# Patient Record
Sex: Male | Born: 1955 | Race: White | Hispanic: No | Marital: Single | Smoking: Former smoker
Health system: Southern US, Community
[De-identification: ages and names within clinical notes are randomized; demographics above are authoritative.]

## PROBLEM LIST (undated history)

## (undated) DIAGNOSIS — I10 Essential (primary) hypertension: Secondary | ICD-10-CM

## (undated) DIAGNOSIS — M1990 Unspecified osteoarthritis, unspecified site: Secondary | ICD-10-CM

## (undated) DIAGNOSIS — T7840XA Allergy, unspecified, initial encounter: Secondary | ICD-10-CM

## (undated) DIAGNOSIS — I219 Acute myocardial infarction, unspecified: Secondary | ICD-10-CM

## (undated) HISTORY — DX: Allergy, unspecified, initial encounter: T78.40XA

## (undated) HISTORY — DX: Acute myocardial infarction, unspecified: I21.9

## (undated) HISTORY — PX: MULTIPLE TOOTH EXTRACTIONS: SHX2053

## (undated) HISTORY — DX: Unspecified osteoarthritis, unspecified site: M19.90

---

## 2011-11-19 ENCOUNTER — Ambulatory Visit (INDEPENDENT_AMBULATORY_CARE_PROVIDER_SITE_OTHER): Payer: 59 | Admitting: Physician Assistant

## 2011-11-19 DIAGNOSIS — J301 Allergic rhinitis due to pollen: Secondary | ICD-10-CM

## 2011-11-19 MED ORDER — FLUTICASONE PROPIONATE 50 MCG/ACT NA SUSP: 2.0000 | Freq: Every day | NASAL | Status: DC

## 2011-11-19 NOTE — Patient Instructions (Signed)
Use Zadiator or Naphcon A for itchy allergic eyes.  You can put these in the refrigerator for more relief.

## 2011-11-19 NOTE — Progress Notes (Signed)
  Subjective:    Patient ID: GRANVEL PROUDFOOT, male    DOB: 01-01-1956, 56 y.o.   MRN: [id number]  HPI Mr. Gin comes in today c/o head and sinus congestion with itchy eyes for 2 days. No ST, cough or fever.  Has had trouble with allergies when pollen first falls.  He has used Flonase in the past with great results but ran out and hasn't needed it for several years. Very tired and no energy with onset of symptoms.   Review of Systems As above    Objective:   Physical Exam  Constitutional: He is oriented to person, place, and time. Vital signs are normal.  HENT:  Right Ear: Tympanic membrane normal.  Left Ear: Tympanic membrane normal.  Nose: Mucosal edema and rhinorrhea present.  Mouth/Throat: Oropharynx is clear and moist.  Eyes: Lids are normal. Right conjunctiva is injected. Left conjunctiva is injected.  Cardiovascular: Normal rate and regular rhythm.   Pulmonary/Chest: Effort normal and breath sounds normal.  Neurological: He is alert and oriented to person, place, and time.  Skin: Skin is warm.          Assessment & Plan:  Allergic rhinitis  Flonase with refills.  Zadiator or Naphcon A for eyes. Claritin or Zyrtec. Watch BP.  Needs CPE.

## 2012-06-12 ENCOUNTER — Ambulatory Visit (INDEPENDENT_AMBULATORY_CARE_PROVIDER_SITE_OTHER): Payer: 59 | Admitting: Emergency Medicine

## 2012-06-12 VITALS — BP 144/92 | HR 74 | Temp 98.1°F | Resp 18 | Ht 72.25 in | Wt 275.0 lb

## 2012-06-12 DIAGNOSIS — H00019 Hordeolum externum unspecified eye, unspecified eyelid: Secondary | ICD-10-CM

## 2012-06-12 DIAGNOSIS — H00029 Hordeolum internum unspecified eye, unspecified eyelid: Secondary | ICD-10-CM

## 2012-06-12 MED ORDER — DOXYCYCLINE HYCLATE 100 MG PO CAPS: 100.0000 mg | ORAL_CAPSULE | Freq: Two times a day (BID) | ORAL | Status: DC

## 2012-06-12 MED ORDER — TOBRAMYCIN 0.3 % OP OINT: TOPICAL_OINTMENT | OPHTHALMIC | Status: DC

## 2012-06-12 NOTE — Progress Notes (Signed)
  Subjective:    Patient ID: Mason Mcdonald, male    DOB: October 07, 1955, 56 y.o.   MRN: [id number]  HPI patient states he started having pain in both eyes last night. His eyes feel uncomfortable and swollen. This morning he woke up and his right eye was swollen shut. He noticed crusting in his right. He has significant swelling of the lower lid.    Review of Systems     Objective:   Physical Exam pupils equal round regular react to light direct and consensual. There are no preauricular nodes. There is significant swelling of the right lower lid. This is very tender to touch.        Assessment & Plan:  Tobramycin ointment was instilled in the right. Prescription to be given for doxycycline 100 twice a day and tobramycin ointment were applied to the lower lid 4 times a day

## 2012-06-12 NOTE — Patient Instructions (Addendum)
Sty  A sty (hordeolum) is an infection of a gland in the eyelid located at the base of the eyelash. A sty may develop a white or yellow head of pus. It can be puffy (swollen). Usually, the sty will burst and pus will come out on its own. They do not leave lumps in the eyelid once they drain.  A sty is often confused with another form of cyst of the eyelid called a chalazion. Chalazions occur within the eyelid and not on the edge where the bases of the eyelashes are. They often are red, sore and then form firm lumps in the eyelid.  CAUSES    Germs (bacteria).   Lasting (chronic) eyelid inflammation.  SYMPTOMS    Tenderness, redness and swelling along the edge of the eyelid at the base of the eyelashes.   Sometimes, there is a white or yellow head of pus. It may or may not drain.  DIAGNOSIS   An ophthalmologist will be able to distinguish between a sty and a chalazion and treat the condition appropriately.   TREATMENT    Styes are typically treated with warm packs (compresses) until drainage occurs.   In rare cases, medicines that kill germs (antibiotics) may be prescribed. These antibiotics may be in the form of drops, cream or pills.   If a hard lump has formed, it is generally necessary to do a small incision and remove the hardened contents of the cyst in a minor surgical procedure done in the office.   In suspicious cases, your caregiver may send the contents of the cyst to the lab to be certain that it is not a rare, but dangerous form of cancer of the glands of the eyelid.  HOME CARE INSTRUCTIONS    Wash your hands often and dry them with a clean towel. Avoid touching your eyelid. This may spread the infection to other parts of the eye.   Apply heat to your eyelid for 10 to 20 minutes, several times a day, to ease pain and help to heal it faster.   Do not squeeze the sty. Allow it to drain on its own. Wash your eyelid carefully 3 to 4 times per day to remove any pus.  SEEK IMMEDIATE MEDICAL CARE IF:     Your eye becomes painful or puffy (swollen).   Your vision changes.   Your sty does not drain by itself within 3 days.   Your sty comes back within a short period of time, even with treatment.   You have redness (inflammation) around the eye.   You have a fever.  Document Released: 05/08/2005 Document Revised: 10/21/2011 Document Reviewed: 01/10/2009  ExitCare Patient Information 2013 [employer].

## 2012-07-06 ENCOUNTER — Inpatient Hospital Stay (HOSPITAL_COMMUNITY)
Admission: EM | Admit: 2012-07-06 | Discharge: 2012-07-08 | DRG: 247 | Disposition: A | Discharge status: Home / Self Care | Payer: 59 | Source: Ambulatory Visit | Attending: Cardiology | Admitting: Cardiology

## 2012-07-06 ENCOUNTER — Encounter (HOSPITAL_COMMUNITY): Payer: Self-pay | Admitting: Cardiology

## 2012-07-06 ENCOUNTER — Encounter (HOSPITAL_COMMUNITY)
Admission: EM | Disposition: A | Discharge status: Home / Self Care | Payer: Self-pay | Source: Ambulatory Visit | Attending: Cardiology

## 2012-07-06 ENCOUNTER — Other Ambulatory Visit: Payer: Self-pay

## 2012-07-06 ENCOUNTER — Encounter: Payer: Self-pay | Admitting: Family Medicine

## 2012-07-06 ENCOUNTER — Ambulatory Visit (INDEPENDENT_AMBULATORY_CARE_PROVIDER_SITE_OTHER): Payer: 59 | Admitting: Family Medicine

## 2012-07-06 VITALS — BP 170/110 | HR 116 | Resp 24

## 2012-07-06 DIAGNOSIS — M79609 Pain in unspecified limb: Secondary | ICD-10-CM

## 2012-07-06 DIAGNOSIS — R079 Chest pain, unspecified: Secondary | ICD-10-CM

## 2012-07-06 DIAGNOSIS — Z6835 Body mass index (BMI) 35.0-35.9, adult: Secondary | ICD-10-CM

## 2012-07-06 DIAGNOSIS — Z955 Presence of coronary angioplasty implant and graft: Secondary | ICD-10-CM

## 2012-07-06 DIAGNOSIS — I2119 ST elevation (STEMI) myocardial infarction involving other coronary artery of inferior wall: Principal | ICD-10-CM | POA: Diagnosis present

## 2012-07-06 DIAGNOSIS — R0602 Shortness of breath: Secondary | ICD-10-CM

## 2012-07-06 DIAGNOSIS — E7800 Pure hypercholesterolemia, unspecified: Secondary | ICD-10-CM | POA: Diagnosis present

## 2012-07-06 DIAGNOSIS — I219 Acute myocardial infarction, unspecified: Secondary | ICD-10-CM

## 2012-07-06 DIAGNOSIS — Z823 Family history of stroke: Secondary | ICD-10-CM

## 2012-07-06 DIAGNOSIS — Z8249 Family history of ischemic heart disease and other diseases of the circulatory system: Secondary | ICD-10-CM

## 2012-07-06 DIAGNOSIS — I10 Essential (primary) hypertension: Secondary | ICD-10-CM | POA: Diagnosis present

## 2012-07-06 DIAGNOSIS — I213 ST elevation (STEMI) myocardial infarction of unspecified site: Secondary | ICD-10-CM

## 2012-07-06 DIAGNOSIS — F17200 Nicotine dependence, unspecified, uncomplicated: Secondary | ICD-10-CM | POA: Diagnosis present

## 2012-07-06 HISTORY — PX: LEFT HEART CATHETERIZATION WITH CORONARY ANGIOGRAM: SHX5451

## 2012-07-06 HISTORY — DX: Essential (primary) hypertension: I10

## 2012-07-06 LAB — COMPREHENSIVE METABOLIC PANEL
ALT: 18 U/L (ref 0–53)
AST: 36 U/L (ref 0–37)
Albumin: 3.5 g/dL (ref 3.5–5.2)
Albumin: 3.3 g/dL — ABNORMAL LOW (ref 3.5–5.2)
Alkaline Phosphatase: 50 U/L (ref 39–117)
CO2: 27 mEq/L (ref 19–32)
Calcium: 9.2 mg/dL (ref 8.4–10.5)
Chloride: 100 mEq/L (ref 96–112)
Creatinine, Ser: 0.95 mg/dL (ref 0.50–1.35)
GFR calc non Af Amer: >90 mL/min (ref >90)
Potassium: 3.7 mEq/L (ref 3.5–5.1)
Sodium: 137 mEq/L (ref 135–145)
Total Bilirubin: 0.3 mg/dL (ref 0.3–1.2)
Total Protein: 6.8 g/dL (ref 6.0–8.3)

## 2012-07-06 LAB — CBC
Hemoglobin: 16.5 g/dL (ref 13.0–17.0)
MCHC: 35 g/dL (ref 30.0–36.0)
RDW: 12.9 % (ref 11.5–15.5)
WBC: 8.5 10*3/uL (ref 4.0–10.5)

## 2012-07-06 LAB — LIPID PANEL
HDL: 35 mg/dL — ABNORMAL LOW (ref >39)
LDL Cholesterol: 147 mg/dL — ABNORMAL HIGH (ref 0–99)
Total CHOL/HDL Ratio: 6.2 RATIO
VLDL: 35 mg/dL (ref 0–40)

## 2012-07-06 LAB — CBC WITH DIFFERENTIAL/PLATELET
Basophils Absolute: 0 10*3/uL (ref 0.0–0.1)
Eosinophils Absolute: 0.1 10*3/uL (ref 0.0–0.7)
Eosinophils Relative: 1 % (ref 0–5)
MCH: 30.2 pg (ref 26.0–34.0)
MCHC: 34.5 g/dL (ref 30.0–36.0)
MCV: 87.7 fL (ref 78.0–100.0)
Platelets: 217 10*3/uL (ref 150–400)
RDW: 13.2 % (ref 11.5–15.5)

## 2012-07-06 LAB — APTT
aPTT: 163 seconds — ABNORMAL HIGH (ref 24–37)
aPTT: 94 seconds — ABNORMAL HIGH (ref 24–37)

## 2012-07-06 LAB — CK TOTAL AND CKMB (NOT AT ARMC): Total CK: 182 U/L (ref 7–232)

## 2012-07-06 LAB — PROTIME-INR
INR: 7.98 (ref 0.00–1.49)
INR: 1.79 — ABNORMAL HIGH (ref 0.00–1.49)
Prothrombin Time: 61 seconds — ABNORMAL HIGH (ref 11.6–15.2)

## 2012-07-06 LAB — TROPONIN I: Troponin I: 0.89 ng/mL (ref <0.30)

## 2012-07-06 LAB — MAGNESIUM: Magnesium: 2.1 mg/dL (ref 1.5–2.5)

## 2012-07-06 LAB — POCT ACTIVATED CLOTTING TIME: Activated Clotting Time: 384 seconds

## 2012-07-06 SURGERY — LEFT HEART CATHETERIZATION WITH CORONARY ANGIOGRAM
Anesthesia: LOCAL

## 2012-07-06 MED ORDER — ATORVASTATIN CALCIUM 80 MG PO TABS
80.0000 mg | ORAL_TABLET | Freq: Every day | ORAL | Status: DC
  Administered 2012-07-06 – 2012-07-07 (×2): 80 mg via ORAL
  Filled 2012-07-06 (×3): qty 1

## 2012-07-06 MED ORDER — BIVALIRUDIN 250 MG IV SOLR
INTRAVENOUS | Status: AC
  Filled 2012-07-06: qty 250

## 2012-07-06 MED ORDER — METOPROLOL TARTRATE 12.5 MG HALF TABLET: 6.2500 mg | ORAL_TABLET | Freq: Two times a day (BID) | ORAL | Status: DC

## 2012-07-06 MED ORDER — FENTANYL CITRATE 0.05 MG/ML IJ SOLN
INTRAMUSCULAR | Status: AC
  Filled 2012-07-06: qty 2

## 2012-07-06 MED ORDER — EPTIFIBATIDE BOLUS VIA INFUSION
180.0000 ug/kg | Freq: Once | INTRAVENOUS | Status: AC
  Administered 2012-07-06: 22400 ug via INTRAVENOUS
  Filled 2012-07-06: qty 30

## 2012-07-06 MED ORDER — NITROGLYCERIN IN D5W 200-5 MCG/ML-% IV SOLN: 10.0000 ug/min | INTRAVENOUS | Status: DC

## 2012-07-06 MED ORDER — SODIUM CHLORIDE 0.9 % IV SOLN: INTRAVENOUS | Status: DC | PRN

## 2012-07-06 MED ORDER — PNEUMOCOCCAL VAC POLYVALENT 25 MCG/0.5ML IJ INJ
0.5000 mL | INJECTION | INTRAMUSCULAR | Status: AC
  Filled 2012-07-06: qty 0.5

## 2012-07-06 MED ORDER — TICAGRELOR 90 MG PO TABS
ORAL_TABLET | ORAL | Status: AC
  Administered 2012-07-06: 90 mg via ORAL
  Filled 2012-07-06: qty 2

## 2012-07-06 MED ORDER — LIDOCAINE HCL (PF) 1 % IJ SOLN
INTRAMUSCULAR | Status: AC
  Filled 2012-07-06: qty 30

## 2012-07-06 MED ORDER — MIDAZOLAM HCL 2 MG/2ML IJ SOLN
INTRAMUSCULAR | Status: AC
  Filled 2012-07-06: qty 2

## 2012-07-06 MED ORDER — MORPHINE SULFATE 10 MG/ML IJ SOLN
INTRAMUSCULAR | Status: AC
  Filled 2012-07-06: qty 1

## 2012-07-06 MED ORDER — METOPROLOL TARTRATE 12.5 MG HALF TABLET
12.5000 mg | ORAL_TABLET | Freq: Two times a day (BID) | ORAL | Status: DC
  Filled 2012-07-06 (×2): qty 1

## 2012-07-06 MED ORDER — ACETAMINOPHEN 325 MG PO TABS: 650.0000 mg | ORAL_TABLET | ORAL | Status: DC | PRN

## 2012-07-06 MED ORDER — HEPARIN (PORCINE) IN NACL 2-0.9 UNIT/ML-% IJ SOLN
INTRAMUSCULAR | Status: AC
  Filled 2012-07-06: qty 1000

## 2012-07-06 MED ORDER — EPTIFIBATIDE 75 MG/100ML IV SOLN
INTRAVENOUS | Status: AC
  Administered 2012-07-06: 2 ug/kg/min via INTRAVENOUS
  Filled 2012-07-06: qty 100

## 2012-07-06 MED ORDER — SODIUM CHLORIDE 0.9 % IV SOLN: 0.2500 mg/kg/h | Freq: Once | INTRAVENOUS | Status: DC

## 2012-07-06 MED ORDER — EPTIFIBATIDE 75 MG/100ML IV SOLN
2.0000 ug/kg/min | INTRAVENOUS | Status: AC
  Administered 2012-07-06 – 2012-07-07 (×3): 2 ug/kg/min via INTRAVENOUS
  Filled 2012-07-06 (×5): qty 100

## 2012-07-06 MED ORDER — ASPIRIN EC 81 MG PO TBEC
81.0000 mg | DELAYED_RELEASE_TABLET | Freq: Every day | ORAL | Status: DC
  Administered 2012-07-07 – 2012-07-08 (×2): 81 mg via ORAL
  Filled 2012-07-06 (×2): qty 1

## 2012-07-06 MED ORDER — ALUM & MAG HYDROXIDE-SIMETH 200-200-20 MG/5ML PO SUSP
30.0000 mL | ORAL | Status: DC | PRN
  Administered 2012-07-06: 30 mL via ORAL
  Filled 2012-07-06: qty 30

## 2012-07-06 MED ORDER — OXYCODONE-ACETAMINOPHEN 5-325 MG PO TABS
2.0000 | ORAL_TABLET | Freq: Three times a day (TID) | ORAL | Status: DC | PRN
  Administered 2012-07-06: 2 via ORAL
  Filled 2012-07-06: qty 2

## 2012-07-06 MED ORDER — ONDANSETRON HCL 4 MG/2ML IJ SOLN: 4.0000 mg | Freq: Four times a day (QID) | INTRAMUSCULAR | Status: DC | PRN

## 2012-07-06 MED ORDER — NITROGLYCERIN 0.2 MG/ML ON CALL CATH LAB
INTRAVENOUS | Status: AC
  Filled 2012-07-06: qty 1

## 2012-07-06 MED ORDER — METOPROLOL TARTRATE 25 MG/10 ML ORAL SUSPENSION
6.2500 mg | Freq: Two times a day (BID) | ORAL | Status: DC
  Administered 2012-07-07 – 2012-07-08 (×3): 6.25 mg via ORAL
  Filled 2012-07-06 (×5): qty 2.5

## 2012-07-06 MED ORDER — TICAGRELOR 90 MG PO TABS
90.0000 mg | ORAL_TABLET | Freq: Two times a day (BID) | ORAL | Status: DC
  Administered 2012-07-06 – 2012-07-08 (×4): 90 mg via ORAL
  Filled 2012-07-06 (×6): qty 1

## 2012-07-06 MED ORDER — MORPHINE SULFATE 4 MG/ML IJ SOLN
2.0000 mg | INTRAMUSCULAR | Status: DC | PRN
  Administered 2012-07-06 (×2): 2 mg via INTRAVENOUS
  Filled 2012-07-06: qty 1

## 2012-07-06 MED ORDER — NITROGLYCERIN IN D5W 200-5 MCG/ML-% IV SOLN
INTRAVENOUS | Status: AC
  Filled 2012-07-06: qty 250

## 2012-07-06 MED ORDER — NITROGLYCERIN 0.4 MG SL SUBL: 0.4000 mg | SUBLINGUAL_TABLET | SUBLINGUAL | Status: DC | PRN

## 2012-07-06 MED ORDER — NITROGLYCERIN IN D5W 200-5 MCG/ML-% IV SOLN
10.0000 ug/min | INTRAVENOUS | Status: DC
  Administered 2012-07-06: 10 ug/min via INTRAVENOUS

## 2012-07-06 MED ORDER — TETANUS-DIPHTH-ACELL PERTUSSIS 5-2.5-18.5 LF-MCG/0.5 IM SUSP
INTRAMUSCULAR | Status: AC
  Filled 2012-07-06: qty 0.5

## 2012-07-06 MED ORDER — SODIUM CHLORIDE 0.9 % IV SOLN
INTRAVENOUS | Status: AC
  Administered 2012-07-06 (×2): via INTRAVENOUS

## 2012-07-06 NOTE — Progress Notes (Signed)
[facility] [address] [place] [address] [phone]  Date:  07/06/2012   Name:  Mason Mcdonald   DOB:  03/01/1956   MRN:  [id number]  PCP:  No primary provider on file.    Chief Complaint: No chief complaint on file.   History of Present Illness:  Mason Mcdonald is a 56 y.o. very pleasant male patient who presents with the following:  Brought back urgently with CP today. He has had CP since Saturday off and on.  He has noted CP since about 0800 and it has been a "constand weight on my chest."  He has not noted SOB  He has no prior cardiac history except he is a smoker and has a history of borderline HTN.   He had a stress test several years ago and no follow- up was necessary.   Otherwise he is not aware of any major health problems  There is no problem list on file for this patient.   No past medical history on file.  No past surgical history on file.  History  Substance Use Topics  . Smoking status: Current Every Day Smoker -- 1.0 packs/day for 3 years    Types: Cigarettes  . Smokeless tobacco: Not on file  . Alcohol Use: 0.6 oz/week    1 Cans of beer per week    Family History  Problem Relation Age of Onset  . Cancer Mother   . Heart disease Father   . Stroke Maternal Grandmother   . Stroke Maternal Grandfather   . Cancer Paternal Grandfather     No Known Allergies  Medication list has been reviewed and updated.  Current Outpatient Prescriptions on File Prior to Visit  Medication Sig Dispense Refill  . doxycycline (VIBRAMYCIN) 100 MG capsule Take 1 capsule (100 mg total) by mouth 2 (two) times daily.  20 capsule  0  . fluticasone (FLONASE) 50 MCG/ACT nasal spray Place 2 sprays into the nose daily.  16 g  6  . tobramycin (TOBREX) 0.3 % ophthalmic ointment Thin layer to right lower lid 4 times a day  3.5 g  0    Review of Systems:  As per HPI- otherwise negative.   Physical Examination: There were no vitals filed for this  visit. There were no vitals filed for this visit. There is no height or weight on file to calculate BMI. Ideal Body Weight:    GEN: WDWN, NAD, Non-toxic, A & O x 3, overweight, appears to be in pain HEENT: Atraumatic, Normocephalic. Neck supple. No masses, No LAD. Ears and Nose: No external deformity. CV: RRR, No M/G/R. No JVD. No thrill. No extra heart sounds. PULM: CTA B, no wheezes, crackles, rhonchi. No retractions. No resp. distress. No accessory muscle use EXTR: No c/c/e NEURO Normal gait.  PSYCH: Normally interactive. Conversant. Not depressed or anxious appearing.  Calm demeanor.   #4 81mg  ASA given immediately.   Placed [place] O2, 2L EKG shows ST elevation in II, III and aVF and precordial ST depression.  We do not have an old EKG for comparison IV 20 gauge placed left AC at 9:30 am.  BP 170/110 Nitro 0.4 SL given at 9:35 am, BP still 170 /100 2nd nitro given at 9:40 Pain decreased from 8-9/10 to 6/10 after 2nd nitro.    Assessment and Plan: 1. Chest pain   2. Acute MI    Mason Mcdonald appears to be having a STEMI today.  EMS called for  emergent transport.  They alerted cath lab for code STEMI.  Called Mason Mcdonald's wife Asher Muir and alerted her to meet him at the [facility] ED.  Appreciate cardiology and ED care of this patient/    Abbe Amsterdam, MD

## 2012-07-06 NOTE — CV Procedure (Signed)
Left cardiac cath/PTCA stenting report dictated on 07/06/2012 dictation number is [id number]

## 2012-07-06 NOTE — Progress Notes (Signed)
Right femoral arterial sheath removed without difficulty. Held pressure for 20 min. No hematoma or bleeding.Postive dp pulses before and after sheath removal. Dressing applied and patient instr. In precautions.

## 2012-07-06 NOTE — Progress Notes (Signed)
ANTICOAGULATION CONSULT NOTE - Initial Consult  Pharmacy Consult for integrilin Indication: ACS s/p cath  No Known Allergies  Patient Measurements: Wt= 125kg  Vital Signs: BP: 170/110 mmHg (11/25 1002) Pulse Rate: 116  (11/25 1002)  Labs:  Basename 07/06/12 1030  HGB 16.5  HCT 47.1  PLT 215  APTT 163*  LABPROT 61.0*  INR 7.98*  HEPARINUNFRC --  CREATININE 1.00  CKTOTAL 182  CKMB 11.4*  TROPONINI 0.89*    Medical History: No past medical history on file.  Medications:  Scheduled:    . [COMPLETED] bivalirudin      . bivalirudin (ANGIOMAX) infusion 5 mg/mL (Cath Lab,ACS,PCI indication)  0.25 mg/kg/hr Intravenous Once  . [COMPLETED] eptifibatide      . [COMPLETED] fentaNYL      . [COMPLETED] heparin      . [COMPLETED] lidocaine      . [COMPLETED] midazolam      . [COMPLETED] morphine      . [COMPLETED] nitroGLYCERIN      . [COMPLETED] nitroGLYCERIN      . [COMPLETED] TDaP      . [COMPLETED] Ticagrelor        Assessment: 56 yo male s/p cath to begin integrilin.   Plan -Integrilin single bolus followed by 2mg /kg/min -Platelet count in 8hrs  Harland German, Pharm D 07/06/2012 12:27 PM

## 2012-07-06 NOTE — H&P (Cosign Needed)
Mason Mcdonald is an 56 y.o. male.   Chief Complaint: Chest pain HPI: Patient is 56 year old male with past medical history significant for hypertension tobacco abuse morbid obesity positive family history of coronary artery disease not on any medications came to [facility] cath lab directly as code STEMI was called. Patient complaints of recurrent retrosternal chest pain since Saturday off and on this morning woke up with severe chest pain radiating to both arms associated with mild shortness of breath EKG done on the field showed a sinus tachycardia with right bundle branch block with the Q waves with ST elevation in infero posterior leads . Patient states he did not seek any medical attention as pain got worst today so called EMS. Denies any nausea vomiting diaphoresis denies palpitation lightheadedness or syncope. Denies any cardiac workup in the past.  No past medical history on file.  No past surgical history on file.  Family History  Problem Relation Age of Onset  . Cancer Mother   . Heart disease Father   . Stroke Maternal Grandmother   . Stroke Maternal Grandfather   . Cancer Paternal Grandfather    Social History:  reports that he has been smoking Cigarettes.  He has a 3 pack-year smoking history. He does not have any smokeless tobacco history on file. He reports that he drinks about .6 ounces of alcohol per week. He reports that he does not use illicit drugs.  Allergies: No Known Allergies  Medications Prior to Admission  Medication Sig Dispense Refill  . doxycycline (VIBRAMYCIN) 100 MG capsule Take 1 capsule (100 mg total) by mouth 2 (two) times daily.  20 capsule  0  . fluticasone (FLONASE) 50 MCG/ACT nasal spray Place 2 sprays into the nose daily.  16 g  6  . tobramycin (TOBREX) 0.3 % ophthalmic ointment Thin layer to right lower lid 4 times a day  3.5 g  0    Results for orders placed during the hospital encounter of 07/06/12 (from the past 48 hour(s))  CBC      Status: Normal   Collection Time   07/06/12 10:30 AM      Component Value Range Comment   WBC 8.5  4.0 - 10.5 K/uL    RBC 5.48  4.22 - 5.81 MIL/uL    Hemoglobin 16.5  13.0 - 17.0 g/dL    HCT 16.1  09.6 - 04.5 %    MCV 85.9  78.0 - 100.0 fL    MCH 30.1  26.0 - 34.0 pg    MCHC 35.0  30.0 - 36.0 g/dL    RDW 40.9  81.1 - 91.4 %    Platelets 215  150 - 400 K/uL   COMPREHENSIVE METABOLIC PANEL     Status: Abnormal   Collection Time   07/06/12 10:30 AM      Component Value Range Comment   Sodium 137  135 - 145 mEq/L    Potassium 3.7  3.5 - 5.1 mEq/L    Chloride 100  96 - 112 mEq/L    CO2 27  19 - 32 mEq/L    Glucose, Bld 115 (*) 70 - 99 mg/dL    BUN 16  6 - 23 mg/dL    Creatinine, Ser 7.82  0.50 - 1.35 mg/dL    Calcium 9.6  8.4 - 95.6 mg/dL    Total Protein 6.8  6.0 - 8.3 g/dL    Albumin 3.5  3.5 - 5.2 g/dL    AST 25  0 - 37 U/L    ALT 18  0 - 53 U/L    Alkaline Phosphatase 50  39 - 117 U/L    Total Bilirubin 0.3  0.3 - 1.2 mg/dL    GFR calc non Af Amer 82 (*) >90 mL/min    GFR calc Af Amer >90  >90 mL/min   LIPID PANEL     Status: Abnormal   Collection Time   07/06/12 10:30 AM      Component Value Range Comment   Cholesterol 217 (*) 0 - 200 mg/dL    Triglycerides 578 (*) <150 mg/dL    HDL 35 (*) >46 mg/dL    Total CHOL/HDL Ratio 6.2      VLDL 35  0 - 40 mg/dL    LDL Cholesterol 962 (*) 0 - 99 mg/dL    No results found.  Review of Systems  Constitutional: Negative for fever, chills and weight loss.  Eyes: Negative for blurred vision and double vision.  Respiratory: Negative for cough, hemoptysis and sputum production.   Cardiovascular: Positive for chest pain. Negative for palpitations, orthopnea, claudication and leg swelling.  Gastrointestinal: Negative for nausea, vomiting and abdominal pain.  Genitourinary: Negative for dysuria.  Neurological: Positive for dizziness. Negative for headaches.    There were no vitals taken for this visit. Physical Exam    Constitutional: He is oriented to person, place, and time. He appears well-developed and well-nourished.  HENT:  Head: Atraumatic.  Eyes: Conjunctivae normal and EOM are normal. Pupils are equal, round, and reactive to light. Left eye exhibits discharge.  Neck: Neck supple. No JVD present. No tracheal deviation present.  Cardiovascular: Normal rate and regular rhythm.   Murmur (Soft systolic murmur and S4 gallop noted) heard. Respiratory: Effort normal and breath sounds normal. No respiratory distress. He has no wheezes.  GI: Soft. Bowel sounds are normal. He exhibits no distension. There is no tenderness. There is no rebound.  Musculoskeletal: He exhibits no edema and no tenderness.  Neurological: He is alert and oriented to person, place, and time.     Assessment/Plan Acute inferoposterolateral Hypertension Morbid obesity Positive family history of coronary artery disease Tobacco abuse Plan  discussed with patient regarding emergency  left cath possible PTCA stenting its risk and benefits i.e. death MI stroke need for emergency CABG local last complications and consents for PCI [facility] N 07/06/2012, 11:39 AM

## 2012-07-07 ENCOUNTER — Other Ambulatory Visit: Payer: Self-pay

## 2012-07-07 LAB — CBC
MCH: 29.6 pg (ref 26.0–34.0)
MCHC: 33.8 g/dL (ref 30.0–36.0)
Platelets: 223 10*3/uL (ref 150–400)

## 2012-07-07 LAB — LIPID PANEL
Cholesterol: 200 mg/dL (ref 0–200)
Total CHOL/HDL Ratio: 6.3 RATIO

## 2012-07-07 LAB — TSH: TSH: 1.737 u[IU]/mL (ref 0.350–4.500)

## 2012-07-07 LAB — BASIC METABOLIC PANEL
Calcium: 9.4 mg/dL (ref 8.4–10.5)
GFR calc non Af Amer: 77 mL/min — ABNORMAL LOW (ref >90)
Glucose, Bld: 117 mg/dL — ABNORMAL HIGH (ref 70–99)
Sodium: 138 mEq/L (ref 135–145)

## 2012-07-07 MED ORDER — ALPRAZOLAM 0.25 MG PO TABS
0.2500 mg | ORAL_TABLET | Freq: Three times a day (TID) | ORAL | Status: DC | PRN
  Administered 2012-07-07: 0.25 mg via ORAL
  Filled 2012-07-07: qty 1

## 2012-07-07 MED FILL — Dextrose Inj 5%: INTRAVENOUS | Qty: 50 | Status: AC

## 2012-07-07 NOTE — Progress Notes (Signed)
Pt may benefit from outpatient sleep study.  When sleeping pt snores very loudly and HR drops to the 40's then goes back up to 70-80's.  Pt aroused easily and denies complaints.  He also stated that his doctors at [facility] had mentioned that he needed a sleep study in the past but it was never set up. Dierdre Highman

## 2012-07-07 NOTE — Progress Notes (Signed)
CARDIAC REHAB PHASE I   PRE:  Rate/Rhythm: 68 SR    BP: sitting 144/76    SaO2:   MODE:  Ambulation: 550 ft   POST:  Rate/Rhythm: 100    BP: sitting 164/90     SaO2:   Tolerated well. No c/o. BP elevated after walk. Pt sts he had what he wondered was a panic attack earlier. Feels congested and couldn't breathe well. Ed began including smoking cessation, meds, risk factor modification and CRPII. Requests his name be sent to [place] CRPII. Pt eager to make change. Will f/u in am to finish ed. [phone]  Harriet Masson CES, ACSM

## 2012-07-07 NOTE — Cardiovascular Report (Signed)
NAME:  Mason Mcdonald, WASZAK NO.:  [id number]  MEDICAL RECORD NO.:  [id number]  LOCATION:  2906                         FACILITY:  [facility]  PHYSICIAN:  Eduardo Osier. Sharyn Lull, M.D. DATE OF BIRTH:  1956/07/18  DATE OF PROCEDURE:  07/06/2012 DATE OF DISCHARGE:                           CARDIAC CATHETERIZATION   PROCEDURES: 1. Left cardiac cath with selective left and right coronary     angiography, left ventriculography, via right groin using Judkins     technique. 2. Successful percutaneous transluminal coronary angioplasty to mid     left circumflex using 2.5 x 12 mm long Emerge balloon. 3. Successful deployment of 3.0 x 28 mm long Xience extubation drug-     eluting stent in mid left circumflex. 4. Successful postdilatation of the stent using 3.5 x 15 mm long [place]     Quantum balloon.  INDICATION FOR THE PROCEDURE:  Mr. Mason Mcdonald is a 56 year old male with past medical history significant for hypertension, tobacco abuse, morbid obesity, positive family history of coronary artery disease.  Not on any medications.  He came to [facility] Cath Lab directly as code STEMI was called.  The patient complained of recurrent retrosternal chest pain since Saturday off and on.  This morning woke up with severe chest pain radiating to both arms associated with mild shortness of breath.  EKG done on the field showed sinus tachycardia with right bundle branch block with Q-waves with ST elevation in the inferoposterior leads.  The patient states he did not seek any medical attention.  His pain got worse today, so called EMS.  Denies any nausea, vomiting, diaphoresis.  Denies palpitation, lightheadedness, or syncope. Denies any cardiac workup in the past.  Due to typical anginal chest pain, EKG changes, discussed briefly with the patient in the cath lab regarding emergency left cath, possible PTCA stenting, its risks and benefits i.e. death, MI, stroke, need for emergency CABG, local  vascular complications etc and consented for PCI.  PROCEDURE IN DETAIL:  After obtaining informed consent, the patient was placed on fluoroscopy table.  Right and left groin were prepped and draped in usual fashion.  Xylocaine 1% was used for local anesthesia in the right groin.  With the help of thin wall needle, 6-French arterial sheath was placed.  The sheath was aspirated and flushed.  Next, 6- French left Judkins catheter was advanced over the wire under fluoroscopic guidance up to the ascending aorta.  Wire was pulled out. The catheter was aspirated and connected.  Manifold catheter was further advanced and engaged into left coronary ostium.  Multiple views of the left system were taken.  Next, the catheter was disengaged and was pulled out over the wire and was replaced with 6-French right Judkins catheter, which was advanced over the wire under fluoroscopic guidance up to the ascending aorta.  Wire was pulled out.  The catheter was aspirated and connected to the Manifold.  Catheter was further advanced and engaged into right coronary ostium.  Multiple views of the right system were taken.  Next, catheter was disengaged and was pulled out over the wire and was replaced with 6-French pigtail catheter at the end of  the procedure which was advanced over the wire under fluoroscopic guidance up to the ascending aorta.  Catheter was further advanced across the aortic valve into the LV.  LV pressures were recorded.  Next, LV graphy was done in 30-degree RAO position.  Postangiographic pressures were recorded from LV and then pullback pressures were recorded from the aorta.  There was no significant gradient across the aortic valve.  Next, pigtail catheter was pulled out over the wire. Sheaths were aspirated and flushed.  FINDINGS:  LV showed inferior, mid wall severe hypokinesis, EF of approximately 50%.  Left main was patent.  LAD has 15-20% proximal and mid stenosis.  Diagonal 1 was  large which has 20% proximal stenosis. Left circumflex was 100% occluded beyond the midportion.  RCA has 50-60% proximal __________ anteriorly to 30% distal stenosis prior to the bifurcation.  PDA and PLV branches were small which were patent.  RCA was providing collaterals to the distal left circumflex.  INTERVENTIONAL PROCEDURE:  Successful PTCA to mid left circumflex/obtuse marginal was done using 2.5 x 12 mm long Emerge balloon for predilatation and then 3.0 x 28 mm long Xience expedition drug-eluting stent was deployed at 11 atmospheric pressure.  The stent was postdilated using 3.5 x 15 mm long [place] Quantum balloon going up to 13 atmospheric pressure and lesion dilated from 100% to 0% residual with excellent TIMI grade 3 distal flow without evidence of dissection or distal embolization.  The patient received weight based Angiomax and 180 mg of Brilinta prior to the procedure.  The patient tolerated the procedure well.  There were no complications.  The patient was transferred to recovery room in stable condition.     Eduardo Osier. Sharyn Lull, M.D.     MNH/MEDQ  D:  07/06/2012  T:  07/07/2012  Job:  [id number]

## 2012-07-07 NOTE — Care Management Note (Signed)
    Page 1 of 1   07/07/2012     10:11:24 AM   CARE MANAGEMENT NOTE 07/07/2012  Patient:  Mason Mcdonald, Mason Mcdonald   Account Number:  [id number]  Date Initiated:  07/06/2012  Documentation initiated by:  Junius Creamer  Subjective/Objective Assessment:   adm w ch pain     Action/Plan:   lives w wife, goes to [facility] urgent care as pcp   Anticipated DC Date:     Anticipated DC Plan:  HOME/SELF CARE      DC Planning Services  CM consult  Medication Assistance      Choice offered to / List presented to:             Status of service:   Medicare Important Message given?   (If response is "NO", the following Medicare IM given date fields will be blank) Date Medicare IM given:   Date Additional Medicare IM given:    Discharge Disposition:  HOME/SELF CARE  Per UR Regulation:  Reviewed for med. necessity/level of care/duration of stay  If discussed at Long Length of Stay Meetings, dates discussed:    Comments:  11/26 10am debbie Sanaya Gwilliam rn,bsn spoke w pt. gave pt brilinta 30day free card and copay assist card. he states he does have ins that covers meds. did leave pt assist form and copay card for brand name meds also. could not find exact copay amt.  11/25 13:28 debbie Kaylianna Detert rn,bsn [phone]

## 2012-07-07 NOTE — Progress Notes (Signed)
Subjective:  Patient denies any chest pain or shortness of breath states feels much better  Objective:  Vital Signs in the last 24 hours: Temp:  [97.7 F (36.5 C)-98.1 F (36.7 C)] 97.8 F (36.6 C) (11/26 0807) Pulse Rate:  [48-85] 85  (11/26 1011) Resp:  [9-16] 10  (11/26 0800) BP: (93-144)/(53-92) 127/61 mmHg (11/26 0700) SpO2:  [94 %-99 %] 97 % (11/26 0800) Arterial Line BP: (122-137)/(73-82) 122/74 mmHg (11/25 1545) Weight:  [123 kg (271 lb 2.7 oz)-124.7 kg (274 lb 14.6 oz)] 123 kg (271 lb 2.7 oz) (11/25 2000)  Intake/Output from previous day: 11/25 0701 - 11/26 0700 In: 2487.5 [P.O.:600; I.V.:1887.5] Out: 2175 [Urine:2175] Intake/Output from this shift:    Physical Exam: Neck: no adenopathy, no carotid bruit, no JVD and supple, symmetrical, trachea midline Lungs: clear to auscultation bilaterally Heart: regular rate and rhythm, S1, S2 normal, no murmur, click, rub or gallop Abdomen: soft, non-tender; bowel sounds normal; no masses,  no organomegaly Extremities: extremities normal, atraumatic, no cyanosis or edema and Right groin stable no evidence of hematoma or bruit  Lab Results:  Basename 07/07/12 0525 07/06/12 1424  WBC 9.6 9.0  HGB 15.7 15.3  PLT 223 217    Basename 07/07/12 0525 07/06/12 1424  NA 138 138  K 3.6 4.0  CL 101 102  CO2 28 27  GLUCOSE 117* 103*  BUN 13 15  CREATININE 1.06 0.95    Basename 07/06/12 2353 07/06/12 1030  TROPONINI 7.80* 0.89*   Hepatic Function Panel  Basename 07/06/12 1424  PROT 6.2  ALBUMIN 3.3*  AST 36  ALT 18  ALKPHOS 46  BILITOT 0.3  BILIDIR --  IBILI --    Basename 07/07/12 0525  CHOL 200   No results found for this basename: PROTIME in the last 72 hours  Imaging: Imaging results have been reviewed and No results found.  Cardiac Studies:  Assessment/Plan:  Status post Acute inferoposterolateral  Hypertension  Morbid obesity  Positive family history of coronary artery disease  Tobacco  abuse Plan Phase I cardiac rehabilitation Transfer to telemetry Check labs in a.m.  LOS: 1 day    Jaylin Benzel N 07/07/2012, 10:46 AM

## 2012-07-08 LAB — BASIC METABOLIC PANEL
CO2: 26 mEq/L (ref 19–32)
Chloride: 103 mEq/L (ref 96–112)
Creatinine, Ser: 1.16 mg/dL (ref 0.50–1.35)
Glucose, Bld: 101 mg/dL — ABNORMAL HIGH (ref 70–99)

## 2012-07-08 LAB — CK TOTAL AND CKMB (NOT AT ARMC)
CK, MB: 4.7 ng/mL — ABNORMAL HIGH (ref 0.3–4.0)
Total CK: 136 U/L (ref 7–232)

## 2012-07-08 LAB — CBC
Hemoglobin: 15.5 g/dL (ref 13.0–17.0)
MCV: 87.4 fL (ref 78.0–100.0)
Platelets: 218 10*3/uL (ref 150–400)
RBC: 5.33 MIL/uL (ref 4.22–5.81)
WBC: 10.4 10*3/uL (ref 4.0–10.5)

## 2012-07-08 LAB — TROPONIN I: Troponin I: 2.1 ng/mL (ref <0.30)

## 2012-07-08 MED ORDER — RAMIPRIL 2.5 MG PO CAPS: 2.5000 mg | ORAL_CAPSULE | Freq: Every day | ORAL | Status: DC

## 2012-07-08 MED ORDER — ASPIRIN 81 MG PO TBEC: 81.0000 mg | DELAYED_RELEASE_TABLET | Freq: Every day | ORAL | Status: AC

## 2012-07-08 MED ORDER — METOPROLOL SUCCINATE ER 25 MG PO TB24: 12.5000 mg | ORAL_TABLET | Freq: Every day | ORAL | Status: AC

## 2012-07-08 MED ORDER — NITROGLYCERIN 0.4 MG SL SUBL: 0.4000 mg | SUBLINGUAL_TABLET | SUBLINGUAL | Status: AC | PRN

## 2012-07-08 MED ORDER — ATORVASTATIN CALCIUM 80 MG PO TABS: 80.0000 mg | ORAL_TABLET | Freq: Every day | ORAL | Status: AC

## 2012-07-08 MED ORDER — TICAGRELOR 90 MG PO TABS: 90.0000 mg | ORAL_TABLET | Freq: Two times a day (BID) | ORAL | Status: DC

## 2012-07-08 NOTE — Discharge Summary (Signed)
  Discharge summary dictated on 07/08/2012 dictation number is [id number]

## 2012-07-08 NOTE — Discharge Summary (Signed)
NAME:  Mason Mcdonald, Mason Mcdonald NO.:  [id number]  MEDICAL RECORD NO.:  [id number]  LOCATION:  [id number]                        FACILITY:  [facility]  PHYSICIAN:  Eduardo Osier. Sharyn Lull, M.D. DATE OF BIRTH:  02-02-1956  DATE OF ADMISSION:  07/06/2012 DATE OF DISCHARGE:  07/08/2012                              DISCHARGE SUMMARY   ADMITTING DIAGNOSES: 1. Acute inferoposterior wall myocardial infarction, hypertension. 2. Morbid obesity. 3. Positive family history of coronary artery disease 4. Tobacco abuse.  DISCHARGE DIAGNOSES: 1. Status post acute inferoposterior wall myocardial infarction,     status post emergency PTCA stenting to left circumflex/obtuse     marginal. 2. Hypertension. 3. Hypercholesteremia. 4. Morbid obesity. 5. Positive family history of coronary artery disease. 6. Tobacco abuse.  DISCHARGE HOME MEDICATIONS: 1. Enteric-coated aspirin 81 mg 1 tablet daily. 2. Brilinta 90 mg 1 tablet twice daily. 3. Atorvastatin 80 mg 1 tablet daily. 4. Toprol-XL 12.5 mg daily. 5. Ramipril 2.5 mg 1 capsule daily. 6. Nitrostat 0.4 mg sublingual use as directed.  DIET:  Low salt, low cholesterol.  ACTIVITY:  Increase activity slowly as tolerated.  Post PTCA stent instructions have been given.  Follow up with me in 2 weeks.  The patient will be scheduled for phase 2 cardiac rehab as outpatient.  The patient has been discussed at length regarding lifestyle modification, smoking cessations, and diet.  CONDITION AT DISCHARGE:  Stable.  BRIEF HISTORY AND HOSPITAL COURSE:  Mason Mcdonald is a 56 year old male with past medical history significant for hypertension, tobacco abuse, morbid obesity, positive family history of coronary artery disease, not on any medications.  He came to the [facility] cath lab directly as code STEMI was called.  The patient complained of recurrent retrosternal chest pain since Saturday off and on.  This morning woke up with severe chest pain  radiating to both arms, associated with mild shortness of breath.  EKG done on the field showed sinus tachycardia with right bundle-branch block with Q-waves with ST-elevation in inferoposterior leads.  The patient states he did not seek any medical attention.  As pain got worse today, so called EMS.  The patient denies any nausea, vomiting, diaphoresis.  Denies palpitation, lightheadedness, or syncope. Denies any cardiac workup in the past.  PAST MEDICAL HISTORY:  As above.  PAST SURGICAL HISTORY:  None.  SOCIAL HISTORY:  He smokes 1 pack per day for last 3-4 years.  No history of alcohol abuse.  Drinks occasionally socially.  FAMILY HISTORY:  Positive for coronary artery disease.  His father had MI in his 43s.  PHYSICAL EXAMINATION:  GENERAL:  He was alert, awake, oriented x3, in no acute distress.  Hemodynamically stable. EYES:  Conjunctivae were pink. NECK:  Supple.  No JVD. LUNGS:  Clear to auscultation without rhonchi or rales. CARDIOVASCULAR:  S1, S2 was normal.  There was soft systolic murmur and S4 gallop. ABDOMEN:  Soft.  Bowel sounds were present.  Obese, nontender. EXTREMITIES:  There was no clubbing, cyanosis, or edema. NEUROLOGIC:  Grossly intact.  LABORATORY DATA:  Sodium was 137, potassium 3.7, BUN 16, creatinine 1.00.  His first set of troponin-I was 0.89, repeat was 7.80.  Today this morning is 2.10.  CK was 182, MB 11.4, repeat CK this morning is 136, MB 4.7, which is trending down.  Cholesterol was 200, LDL was 130, HDL 32, triglycerides were 191, hemoglobin was 16.5, hematocrit 47.1, white count of 8.5.  Repeat hemoglobin today is 15.5, hematocrit 46.6, white count of 10.4.  His EKG yesterday showed sinus bradycardia with right bundle branch block involving inferior wall MI.  Today EKG showed normal sinus rhythm with sinus arrhythmias, left axis deviation, right bundle-branch block.  BRIEF HOSPITAL COURSE:  The patient was directly brought to the cath  lab and underwent emergency left cath and PTCA stenting to 100% occluded left circumflex/obtuse marginal system.  As per procedure report, the patient tolerated procedure well.  There were no complications. Postprocedure, the patient has not had any episodes of anginal chest pain or any cardiac arrhythmias.  Phase 1 cardiac rehab was called.  The patient has been ambulating in hallway without any problem.  His groin is stable with no evidence of hematoma or bruit.  The patient will be discharged home on above medications and will be followed up in my office in 2 weeks.     Eduardo Osier. Sharyn Lull, M.D.     [facility]  D:  07/08/2012  T:  07/08/2012  Job:  [id number]

## 2012-07-08 NOTE — Progress Notes (Signed)
[id number] Cardiac Rehab Completed discharge education with pt. He voices understanding.

## 2012-07-11 ENCOUNTER — Ambulatory Visit (INDEPENDENT_AMBULATORY_CARE_PROVIDER_SITE_OTHER): Payer: 59 | Admitting: Family Medicine

## 2012-07-11 VITALS — BP 107/73 | HR 78 | Temp 98.6°F | Resp 17 | Ht 72.5 in | Wt 263.0 lb

## 2012-07-11 DIAGNOSIS — I889 Nonspecific lymphadenitis, unspecified: Secondary | ICD-10-CM

## 2012-07-11 DIAGNOSIS — R599 Enlarged lymph nodes, unspecified: Secondary | ICD-10-CM

## 2012-07-11 LAB — POCT SEDIMENTATION RATE: POCT SED RATE: 25 mm/hr — AB (ref 0–22)

## 2012-07-11 LAB — POCT CBC
Granulocyte percent: 64.1 %G (ref 37–80)
HCT, POC: 55.1 % — AB (ref 43.5–53.7)
Hemoglobin: 17.7 g/dL (ref 14.1–18.1)
Lymph, poc: 2.6 (ref 0.6–3.4)
MCH, POC: 29.9 pg (ref 27–31.2)
MCHC: 32.1 g/dL (ref 31.8–35.4)
MCV: 93 fL (ref 80–97)
MID (cbc): 0.6 (ref 0–0.9)
MPV: 8.6 fL (ref 0–99.8)
POC Granulocyte: 5.8 (ref 2–6.9)
POC LYMPH PERCENT: 29 %L (ref 10–50)
POC MID %: 6.9 %M (ref 0–12)
Platelet Count, POC: 315 10*3/uL (ref 142–424)
RBC: 5.92 M/uL (ref 4.69–6.13)
RDW, POC: 13.6 %
WBC: 9.1 10*3/uL (ref 4.6–10.2)

## 2012-07-11 MED ORDER — AMOXICILLIN 875 MG PO TABS: 875.0000 mg | ORAL_TABLET | Freq: Two times a day (BID) | ORAL | Status: DC

## 2012-07-11 NOTE — Progress Notes (Signed)
56 yo male c/o knot in neck.  This did wake patient up in the middle of the night.  Patient was in our office Monday with chest pain and was sent out emergently to [facility].  Was sent to the Cath Lab with 100 % blockage with one and two others with 30 % blockage.  Procedure was done thru right groin and healing well.   Denies any fever, chills The patient was in the hospital he did suffer some heavy nose bleeds.  Objective:   Small node palpable behind left ear. RAFA oropharynx: Clear Skin: No rash, scalp unremarkable Nose: Pain bilaterally  Results for orders placed in visit on 07/11/12  POCT CBC      Component Value Range   WBC 9.1  4.6 - 10.2 K/uL   Lymph, poc 2.6  0.6 - 3.4   POC LYMPH PERCENT 29.0  10 - 50 %L   MID (cbc) 0.6  0 - 0.9   POC MID % 6.9  0 - 12 %M   POC Granulocyte 5.8  2 - 6.9   Granulocyte percent 64.1  37 - 80 %G   RBC 5.92  4.69 - 6.13 M/uL   Hemoglobin 17.7  14.1 - 18.1 g/dL   HCT, POC 40.9 (*) 81.1 - 53.7 %   MCV 93.0  80 - 97 fL   MCH, POC 29.9  27 - 31.2 pg   MCHC 32.1  31.8 - 35.4 g/dL   RDW, POC 91.4     Platelet Count, POC 315  142 - 424 K/uL   MPV 8.6  0 - 99.8 fL   Assessment: inflammatory cervical node  Plan;

## 2012-10-28 ENCOUNTER — Telehealth: Payer: Self-pay | Admitting: Radiology

## 2012-10-28 NOTE — Telephone Encounter (Signed)
Letter to patient per Dr Lauenstein. 

## 2014-03-09 ENCOUNTER — Ambulatory Visit (INDEPENDENT_AMBULATORY_CARE_PROVIDER_SITE_OTHER): Payer: 59 | Admitting: Emergency Medicine

## 2014-03-09 ENCOUNTER — Ambulatory Visit (INDEPENDENT_AMBULATORY_CARE_PROVIDER_SITE_OTHER): Payer: 59

## 2014-03-09 VITALS — BP 126/82 | HR 83 | Temp 99.0°F | Resp 17 | Ht 73.5 in | Wt 277.0 lb

## 2014-03-09 DIAGNOSIS — M79672 Pain in left foot: Secondary | ICD-10-CM

## 2014-03-09 DIAGNOSIS — M79609 Pain in unspecified limb: Secondary | ICD-10-CM

## 2014-03-09 DIAGNOSIS — M722 Plantar fascial fibromatosis: Secondary | ICD-10-CM

## 2014-03-09 MED ORDER — HYDROCODONE-ACETAMINOPHEN 5-325 MG PO TABS: 1.0000 | ORAL_TABLET | Freq: Four times a day (QID) | ORAL | Status: DC | PRN

## 2014-03-09 NOTE — Progress Notes (Signed)
Subjective:  This chart was scribed for Mason ChrisSteven Daub, MD by Elveria Risingimelie Horne, Medial Scribe. This patient was seen in room 14 and the patient's care was started at 11:47 AM.    Patient ID: Mason RobinsonsJohn E Lalley, male    DOB: 1956/07/23, 58 y.o.   MRN: [id number]  HPI HPI Comments: Mason Mcdonald is a 58 y.o. male who presents to the [facility] complaining of suspected plantar faciitis in left foot, ongoing for a couple weeks. 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 Patient reports awakening with mild pain in hell, but reports severe pain in left heel at the end of his work day. By that time patient reports limping/hobbling preferring not to bear any weight on the foot due to pain exacerbation.  Patient reports treatment with ice water soaks and heel inserts in shoes, without relief. Patient reports history of plantar fasciitis in right heel; he is uncertain of treatment.   Patient reports history of arthritis in back and knee treated with cortisone injection.   There are no active problems to display for this patient.  Past Medical History  Diagnosis Date  . Hypertension   . Allergy   . Arthritis   . Myocardial infarction    History reviewed. No pertinent past surgical history. No Known Allergies Prior to Admission medications   Medication Sig Start Date End Date Taking? Authorizing Provider  aspirin EC 81 MG EC tablet Take 1 tablet (81 mg total) by mouth daily. 07/08/12  Yes Robynn PaneMohan N Harwani, MD  atorvastatin (LIPITOR) 80 MG tablet Take 1 tablet (80 mg total) by mouth daily at 6 PM. 07/08/12  Yes Robynn PaneMohan N Harwani, MD  clopidogrel (PLAVIX) 75 MG tablet Take 75 mg by mouth daily.   Yes Historical Provider, MD  metoprolol succinate (TOPROL XL) 25 MG 24 hr tablet Take 0.5 tablets (12.5 mg total) by mouth daily. 07/08/12  Yes Robynn PaneMohan N Harwani, MD  nitroGLYCERIN (NITROSTAT) 0.4 MG SL tablet Place 1 tablet (0.4 mg total) under the tongue  every 5 (five) minutes x 3 doses as needed for chest pain. 07/08/12  Yes Robynn PaneMohan N Harwani, MD  ramipril (ALTACE) 2.5 MG capsule Take 1 capsule (2.5 mg total) by mouth daily. 07/08/12  Yes Robynn PaneMohan N Harwani, MD   History   Social History  . Marital Status: Married    Spouse Name: N/A    Number of Children: N/A  . Years of Education: N/A   Occupational History  . Not on file.   Social History Main Topics  . Smoking status: Former Smoker -- 1.00 packs/day for 3 years    Types: Cigarettes    Quit date: 07/08/2012  . Smokeless tobacco: Not on file  . Alcohol Use: 0.6 oz/week    1 Cans of beer per week  . Drug Use: No  . Sexual Activity: Yes    Birth Control/ Protection: Condom   Other Topics Concern  . Not on file   Social History Narrative  . No narrative on file      Review of Systems  Constitutional: Negative for fever and chills.  Musculoskeletal: Positive for arthralgias.       Objective:   Physical Exam  CONSTITUTIONAL: Well developed/well nourished HEAD: Normocephalic/atraumatic EYES: EOMI/PERRL ENMT: Mucous membranes moist NECK: supple no meningeal signs SPINE:entire spine nontender CV: S1/S2 noted, no murmurs/rubs/gallops noted LUNGS: Lungs are clear to auscultation bilaterally, no apparent distress ABDOMEN: soft, nontender,  no rebound or guarding GU:no cva tenderness NEURO: Pt is awake/alert, moves all extremitiesx4 EXTREMITIES: pulses normal, full ROM; exquisite tenderness over medial portion of left heel; no skin lesions are seen SKIN: warm, color normal PSYCH: no abnormalities of mood noted  Filed Vitals:   03/09/14 1033  BP: 126/82  Pulse: 83  Temp: 99 F (37.2 C)  TempSrc: Oral  Resp: 17  Height: 6' 1.5" (1.867 m)  Weight: 277 lb (125.646 kg)  SpO2: 93%   [facility] reading (PRIMARY) by  Dr.daub   no fracture seen Procedure note. The medial left heel was prepped with Betadine cleaned with alcohol and under sterile technique injected with 40 of  Kenalog along with 2 cc of 2% plain patient tolerated the procedure well     Assessment & Plan:  He will use ice to the area. He was given a limited number of hydrocodone for pain I personally performed the services described in this documentation, which was scribed in my presence. The recorded information has been reviewed and is accurate.

## 2014-03-09 NOTE — Patient Instructions (Signed)
Plantar Fasciitis  Plantar fasciitis is a common condition that causes foot pain. It is soreness (inflammation) of the band of tough fibrous tissue on the bottom of the foot that runs from the heel bone (calcaneus) to the ball of the foot. The cause of this soreness may be from excessive standing, poor fitting shoes, running on hard surfaces, being overweight, having an abnormal walk, or overuse (this is common in runners) of the painful foot or feet. It is also common in aerobic exercise dancers and ballet dancers.  SYMPTOMS   Most people with plantar fasciitis complain of:   Severe pain in the morning on the bottom of their foot especially when taking the first steps out of bed. This pain recedes after a few minutes of walking.   Severe pain is experienced also during walking following a long period of inactivity.   Pain is worse when walking barefoot or up stairs  DIAGNOSIS    Your caregiver will diagnose this condition by examining and feeling your foot.   Special tests such as X-rays of your foot, are usually not needed.  PREVENTION    Consult a sports medicine professional before beginning a new exercise program.   Walking programs offer a good workout. With walking there is a lower chance of overuse injuries common to runners. There is less impact and less jarring of the joints.   Begin all new exercise programs slowly. If problems or pain develop, decrease the amount of time or distance until you are at a comfortable level.   Wear good shoes and replace them regularly.   Stretch your foot and the heel cords at the back of the ankle (Achilles tendon) both before and after exercise.   Run or exercise on even surfaces that are not hard. For example, asphalt is better than pavement.   Do not run barefoot on hard surfaces.   If using a treadmill, vary the incline.   Do not continue to workout if you have foot or joint problems. Seek professional help if they do not improve.  HOME CARE INSTRUCTIONS     Avoid activities that cause you pain until you recover.   Use ice or cold packs on the problem or painful areas after working out.   Only take over-the-counter or prescription medicines for pain, discomfort, or fever as directed by your caregiver.   Soft shoe inserts or athletic shoes with air or gel sole cushions may be helpful.   If problems continue or become more severe, consult a sports medicine caregiver or your own health care provider. Cortisone is a potent anti-inflammatory medication that may be injected into the painful area. You can discuss this treatment with your caregiver.  MAKE SURE YOU:    Understand these instructions.   Will watch your condition.   Will get help right away if you are not doing well or get worse.  Document Released: 04/23/2001 Document Revised: 10/21/2011 Document Reviewed: 06/22/2008  ExitCare Patient Information 2015 [employer]. This information is not intended to replace advice given to you by your health care provider. Make sure you discuss any questions you have with your health care provider.

## 2014-03-16 ENCOUNTER — Ambulatory Visit (INDEPENDENT_AMBULATORY_CARE_PROVIDER_SITE_OTHER): Payer: 59 | Admitting: Emergency Medicine

## 2014-03-16 VITALS — BP 128/78 | HR 85 | Temp 97.6°F | Resp 16 | Ht 73.5 in | Wt 275.0 lb

## 2014-03-16 DIAGNOSIS — M722 Plantar fascial fibromatosis: Secondary | ICD-10-CM

## 2014-03-16 MED ORDER — PREDNISONE 10 MG PO TABS: ORAL_TABLET | ORAL | Status: DC

## 2014-03-16 MED ORDER — HYDROCODONE-ACETAMINOPHEN 5-325 MG PO TABS: 1.0000 | ORAL_TABLET | Freq: Four times a day (QID) | ORAL | Status: DC | PRN

## 2014-03-16 NOTE — Patient Instructions (Signed)
Plantar Fasciitis  Plantar fasciitis is a common condition that causes foot pain. It is soreness (inflammation) of the band of tough fibrous tissue on the bottom of the foot that runs from the heel bone (calcaneus) to the ball of the foot. The cause of this soreness may be from excessive standing, poor fitting shoes, running on hard surfaces, being overweight, having an abnormal walk, or overuse (this is common in runners) of the painful foot or feet. It is also common in aerobic exercise dancers and ballet dancers.  SYMPTOMS   Most people with plantar fasciitis complain of:   Severe pain in the morning on the bottom of their foot especially when taking the first steps out of bed. This pain recedes after a few minutes of walking.   Severe pain is experienced also during walking following a long period of inactivity.   Pain is worse when walking barefoot or up stairs  DIAGNOSIS    Your caregiver will diagnose this condition by examining and feeling your foot.   Special tests such as X-rays of your foot, are usually not needed.  PREVENTION    Consult a sports medicine professional before beginning a new exercise program.   Walking programs offer a good workout. With walking there is a lower chance of overuse injuries common to runners. There is less impact and less jarring of the joints.   Begin all new exercise programs slowly. If problems or pain develop, decrease the amount of time or distance until you are at a comfortable level.   Wear good shoes and replace them regularly.   Stretch your foot and the heel cords at the back of the ankle (Achilles tendon) both before and after exercise.   Run or exercise on even surfaces that are not hard. For example, asphalt is better than pavement.   Do not run barefoot on hard surfaces.   If using a treadmill, vary the incline.   Do not continue to workout if you have foot or joint problems. Seek professional help if they do not improve.  HOME CARE INSTRUCTIONS     Avoid activities that cause you pain until you recover.   Use ice or cold packs on the problem or painful areas after working out.   Only take over-the-counter or prescription medicines for pain, discomfort, or fever as directed by your caregiver.   Soft shoe inserts or athletic shoes with air or gel sole cushions may be helpful.   If problems continue or become more severe, consult a sports medicine caregiver or your own health care provider. Cortisone is a potent anti-inflammatory medication that may be injected into the painful area. You can discuss this treatment with your caregiver.  MAKE SURE YOU:    Understand these instructions.   Will watch your condition.   Will get help right away if you are not doing well or get worse.  Document Released: 04/23/2001 Document Revised: 10/21/2011 Document Reviewed: 06/22/2008  ExitCare Patient Information 2015 [employer]. This information is not intended to replace advice given to you by your health care provider. Make sure you discuss any questions you have with your health care provider.

## 2014-03-16 NOTE — Progress Notes (Signed)
   Subjective:  This chart was scribed for Mason ChrisSteven Keitha Kolk, MD by Marica OtterNusrat Rahman, ED Scribe. This patient was seen in room 1 and the patient's care was started at 2:05 PM.      Patient ID: Mason Mcdonald, male    DOB: 12-Aug-1956, [age] y.o.   MRN: [id number]  HPI  HPI Comments: Mason Mcdonald is a 58 y.o. male who presents to the [facility] complaining of ongoing plantar fasciitis to the left foot. Pt was seen for the same one week ago. Pt was injected with 40 of Kenalog along with 2 cc of 2% plain during his last visit. Pt reports that the injection afforded temporary relief, however, the pain returned after a couple of days. Patient reports history of plantar fasciitis in right heel; and history of arthritis in back and knee. Pt reports he is able to tolerate antiinflammatories.    Review of Systems  Constitutional: Negative for fever, fatigue and unexpected weight change.  Eyes: Negative for visual disturbance.  Respiratory: Negative for cough, chest tightness and shortness of breath.   Cardiovascular: Negative for chest pain, palpitations and leg swelling.  Gastrointestinal: Negative for abdominal pain and blood in stool.  Musculoskeletal:       Severe pain to left heel and bottom of left heel  Neurological: Negative for dizziness, light-headedness and headaches.  Psychiatric/Behavioral: Negative for confusion.       Objective:   Physical Exam  CONSTITUTIONAL: Well developed/well nourished HEAD: Normocephalic/atraumatic EYES: EOMI/PERRL ENMT: Mucous membranes moist NECK: supple no meningeal signs SPINE:entire spine nontender CV: S1/S2 noted, no murmurs/rubs/gallops noted LUNGS: Lungs are clear to auscultation bilaterally, no apparent distress ABDOMEN: soft, nontender, no rebound or guarding GU:no cva tenderness NEURO: Pt is awake/alert, moves all extremitiesx4ee EXTREMITIES: pulses normal, full ROM, exquisite tenderness over the base of the left heel SKIN: warm,  color normal PSYCH: no abnormalities of mood noted    Assessment & Plan:  DIAGNOSTIC STUDIES:    COORDINATION OF CARE: 2:13 PM-Discussed treatment plan which includes long boot and with pt at bedside and pt agreed to plan. Pt reports that he has a long boot at home. He will be on a tapered dose of prednisone followup with orthopedist and refill on his pain medication  I personally performed the services described in this documentation, which was scribed in my presence. The recorded information has been reviewed and is accurate.

## 2014-07-21 ENCOUNTER — Encounter (HOSPITAL_COMMUNITY): Payer: Self-pay | Admitting: Cardiology

## 2015-10-01 ENCOUNTER — Emergency Department (HOSPITAL_COMMUNITY)
Admission: EM | Admit: 2015-10-01 | Discharge: 2015-10-01 | Disposition: A | Discharge status: Home / Self Care | Payer: Managed Care, Other (non HMO) | Attending: Emergency Medicine | Admitting: Emergency Medicine

## 2015-10-01 ENCOUNTER — Encounter (HOSPITAL_COMMUNITY): Payer: Self-pay | Admitting: Emergency Medicine

## 2015-10-01 ENCOUNTER — Emergency Department (HOSPITAL_COMMUNITY): Payer: Managed Care, Other (non HMO)

## 2015-10-01 DIAGNOSIS — I252 Old myocardial infarction: Secondary | ICD-10-CM | POA: Diagnosis not present

## 2015-10-01 DIAGNOSIS — Z87891 Personal history of nicotine dependence: Secondary | ICD-10-CM | POA: Diagnosis not present

## 2015-10-01 DIAGNOSIS — I10 Essential (primary) hypertension: Secondary | ICD-10-CM | POA: Insufficient documentation

## 2015-10-01 DIAGNOSIS — L089 Local infection of the skin and subcutaneous tissue, unspecified: Secondary | ICD-10-CM

## 2015-10-01 DIAGNOSIS — R220 Localized swelling, mass and lump, head: Secondary | ICD-10-CM | POA: Diagnosis present

## 2015-10-01 DIAGNOSIS — Z7902 Long term (current) use of antithrombotics/antiplatelets: Secondary | ICD-10-CM | POA: Diagnosis not present

## 2015-10-01 DIAGNOSIS — Z7982 Long term (current) use of aspirin: Secondary | ICD-10-CM | POA: Insufficient documentation

## 2015-10-01 DIAGNOSIS — M1990 Unspecified osteoarthritis, unspecified site: Secondary | ICD-10-CM | POA: Diagnosis not present

## 2015-10-01 DIAGNOSIS — Z79899 Other long term (current) drug therapy: Secondary | ICD-10-CM | POA: Insufficient documentation

## 2015-10-01 LAB — BASIC METABOLIC PANEL
ANION GAP: 10 (ref 5–15)
BUN: 22 mg/dL — ABNORMAL HIGH (ref 6–20)
CHLORIDE: 103 mmol/L (ref 101–111)
CO2: 26 mmol/L (ref 22–32)
Calcium: 8.8 mg/dL — ABNORMAL LOW (ref 8.9–10.3)
Creatinine, Ser: 0.98 mg/dL (ref 0.61–1.24)
GFR calc Af Amer: >60 mL/min (ref >60)
GFR calc non Af Amer: >60 mL/min (ref >60)
GLUCOSE: 95 mg/dL (ref 65–99)
POTASSIUM: 3.6 mmol/L (ref 3.5–5.1)
Sodium: 139 mmol/L (ref 135–145)

## 2015-10-01 LAB — CBC WITH DIFFERENTIAL/PLATELET
BASOS ABS: 0 10*3/uL (ref 0.0–0.1)
Basophils Relative: 0 %
Eosinophils Absolute: 0.1 10*3/uL (ref 0.0–0.7)
Eosinophils Relative: 1 %
HEMATOCRIT: 46.3 % (ref 39.0–52.0)
Hemoglobin: 15.7 g/dL (ref 13.0–17.0)
LYMPHS PCT: 25 %
Lymphs Abs: 2.3 10*3/uL (ref 0.7–4.0)
MCH: 29.7 pg (ref 26.0–34.0)
MCHC: 33.9 g/dL (ref 30.0–36.0)
MCV: 87.7 fL (ref 78.0–100.0)
MONO ABS: 1 10*3/uL (ref 0.1–1.0)
MONOS PCT: 11 %
NEUTROS ABS: 5.8 10*3/uL (ref 1.7–7.7)
Neutrophils Relative %: 63 %
Platelets: 283 10*3/uL (ref 150–400)
RBC: 5.28 MIL/uL (ref 4.22–5.81)
RDW: 14.1 % (ref 11.5–15.5)
WBC: 9.1 10*3/uL (ref 4.0–10.5)

## 2015-10-01 MED ORDER — MORPHINE SULFATE (PF) 4 MG/ML IV SOLN
4.0000 mg | Freq: Once | INTRAVENOUS | Status: AC
  Administered 2015-10-01: 4 mg via INTRAVENOUS
  Filled 2015-10-01: qty 1

## 2015-10-01 MED ORDER — CLINDAMYCIN PHOSPHATE 600 MG/50ML IV SOLN
600.0000 mg | Freq: Once | INTRAVENOUS | Status: AC
  Administered 2015-10-01: 600 mg via INTRAVENOUS
  Filled 2015-10-01: qty 50

## 2015-10-01 MED ORDER — OXYCODONE-ACETAMINOPHEN 5-325 MG PO TABS: 1.0000 | ORAL_TABLET | ORAL | Status: AC | PRN

## 2015-10-01 MED ORDER — CLINDAMYCIN HCL 300 MG PO CAPS: 300.0000 mg | ORAL_CAPSULE | Freq: Four times a day (QID) | ORAL | Status: AC

## 2015-10-01 MED ORDER — IOHEXOL 300 MG/ML  SOLN
80.0000 mL | Freq: Once | INTRAMUSCULAR | Status: AC | PRN
Start: 2015-10-01 — End: 2015-10-01
  Administered 2015-10-01: 80 mL via INTRAVENOUS

## 2015-10-01 NOTE — ED Notes (Addendum)
Dental abscess with two pulled teeth 9 days prior. Took all his prescribed doses of antibiotics .Right side of face/neck swollen with severe pain. Urgent care told him yesterday he had a neck infection and ear infection. Was treated with antibiotics and anti-inflammatory shots yesterday, woke up today feeling worse. 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 Pain 8/10

## 2015-10-01 NOTE — Discharge Instructions (Signed)
Stop taking Augmentin. Start taking Clindamycin. Follow up with the ENT physician.  Clindamycin capsules What is this medicine? CLINDAMYCIN (KLIN da MYE sin) is a lincosamide antibiotic. It is used to treat certain kinds of bacterial infections. It will not work for colds, flu, or other viral infections. This medicine may be used for other purposes; ask your health care provider or pharmacist if you have questions. What should I tell my health care provider before I take this medicine? They need to know if you have any of these conditions: -kidney disease -liver disease -stomach problems like colitis -an unusual or allergic reaction to clindamycin, lincomycin, or other medicines, foods, dyes like tartrazine or preservatives -pregnant or trying to get pregnant -breast-feeding How should I use this medicine? Take this medicine by mouth with a full glass of water. Follow the directions on the prescription label. You can take this medicine with food or on an empty stomach. If the medicine upsets your stomach, take it with food. Take your medicine at regular intervals. Do not take your medicine more often than directed. Take all of your medicine as directed even if you think your are better. Do not skip doses or stop your medicine early. Talk to your pediatrician regarding the use of this medicine in children. Special care may be needed. Overdosage: If you think you have taken too much of this medicine contact a poison control center or emergency room at once. NOTE: This medicine is only for you. Do not share this medicine with others. What if I miss a dose? If you miss a dose, take it as soon as you can. If it is almost time for your next dose, take only that dose. Do not take double or extra doses. What may interact with this medicine? -birth control pills -chloramphenicol -erythromycin -kaolin products This list may not describe all possible interactions. Give your health care provider a list  of all the medicines, herbs, non-prescription drugs, or dietary supplements you use. Also tell them if you smoke, drink alcohol, or use illegal drugs. Some items may interact with your medicine. What should I watch for while using this medicine? Tell your doctor or healthcare professional if your symptoms do not start to get better or if they get worse. Do not treat diarrhea with over the counter products. Contact your doctor if you have diarrhea that lasts more than 2 days or if it is severe and watery. What side effects may I notice from receiving this medicine? Side effects that you should report to your doctor or health care professional as soon as possible: -allergic reactions like skin rash, itching or hives, swelling of the face, lips, or tongue -dark urine -pain on swallowing -redness, blistering, peeling or loosening of the skin, including inside the mouth -unusual bleeding or bruising -unusually weak or tired -yellowing of eyes or skin Side effects that usually do not require medical attention (report to your doctor or health care professional if they continue or are bothersome): -diarrhea -itching in the rectal or genital area -joint pain -nausea, vomiting -stomach pain This list may not describe all possible side effects. Call your doctor for medical advice about side effects. You may report side effects to FDA at 1-800-FDA-1088. Where should I keep my medicine? Keep out of the reach of children. Store at room temperature between 20 and 25 degrees C (68 and 77 degrees F). Throw away any unused medicine after the expiration date. NOTE: This sheet is a summary. It may not cover  all possible information. If you have questions about this medicine, talk to your doctor, pharmacist, or health care provider.    2016, Elsevier/Gold Standard. (2013-03-04 16:12:32)  Acetaminophen; Oxycodone tablets What is this medicine? ACETAMINOPHEN; OXYCODONE (a set a MEE noe fen; ox i KOE done) is  a pain reliever. It is used to treat moderate to severe pain. This medicine may be used for other purposes; ask your health care provider or pharmacist if you have questions. What should I tell my health care provider before I take this medicine? They need to know if you have any of these conditions: -brain tumor -Crohn's disease, inflammatory bowel disease, or ulcerative colitis -drug abuse or addiction -head injury -heart or circulation problems -if you often drink alcohol -kidney disease or problems going to the bathroom -liver disease -lung disease, asthma, or breathing problems -an unusual or allergic reaction to acetaminophen, oxycodone, other opioid analgesics, other medicines, foods, dyes, or preservatives -pregnant or trying to get pregnant -breast-feeding How should I use this medicine? Take this medicine by mouth with a full glass of water. Follow the directions on the prescription label. You can take it with or without food. If it upsets your stomach, take it with food. Take your medicine at regular intervals. Do not take it more often than directed. Talk to your pediatrician regarding the use of this medicine in children. Special care may be needed. Patients over 36 years old may have a stronger reaction and need a smaller dose. Overdosage: If you think you have taken too much of this medicine contact a poison control center or emergency room at once. NOTE: This medicine is only for you. Do not share this medicine with others. What if I miss a dose? If you miss a dose, take it as soon as you can. If it is almost time for your next dose, take only that dose. Do not take double or extra doses. What may interact with this medicine? -alcohol -antihistamines -barbiturates like amobarbital, butalbital, butabarbital, methohexital, pentobarbital, phenobarbital, thiopental, and secobarbital -benztropine -drugs for bladder problems like solifenacin, trospium, oxybutynin, tolterodine,  hyoscyamine, and methscopolamine -drugs for breathing problems like ipratropium and tiotropium -drugs for certain stomach or intestine problems like propantheline, homatropine methylbromide, glycopyrrolate, atropine, belladonna, and dicyclomine -general anesthetics like etomidate, ketamine, nitrous oxide, propofol, desflurane, enflurane, halothane, isoflurane, and sevoflurane -medicines for depression, anxiety, or psychotic disturbances -medicines for sleep -muscle relaxants -naltrexone -narcotic medicines (opiates) for pain -phenothiazines like perphenazine, thioridazine, chlorpromazine, mesoridazine, fluphenazine, prochlorperazine, promazine, and trifluoperazine -scopolamine -tramadol -trihexyphenidyl This list may not describe all possible interactions. Give your health care provider a list of all the medicines, herbs, non-prescription drugs, or dietary supplements you use. Also tell them if you smoke, drink alcohol, or use illegal drugs. Some items may interact with your medicine. What should I watch for while using this medicine? Tell your doctor or health care professional if your pain does not go away, if it gets worse, or if you have new or a different type of pain. You may develop tolerance to the medicine. Tolerance means that you will need a higher dose of the medication for pain relief. Tolerance is normal and is expected if you take this medicine for a long time. Do not suddenly stop taking your medicine because you may develop a severe reaction. Your body becomes used to the medicine. This does NOT mean you are addicted. Addiction is a behavior related to getting and using a drug for a non-medical reason. If you have  pain, you have a medical reason to take pain medicine. Your doctor will tell you how much medicine to take. If your doctor wants you to stop the medicine, the dose will be slowly lowered over time to avoid any side effects. You may get drowsy or dizzy. Do not drive, use  machinery, or do anything that needs mental alertness until you know how this medicine affects you. Do not stand or sit up quickly, especially if you are an older patient. This reduces the risk of dizzy or fainting spells. Alcohol may interfere with the effect of this medicine. Avoid alcoholic drinks. There are different types of narcotic medicines (opiates) for pain. If you take more than one type at the same time, you may have more side effects. Give your health care provider a list of all medicines you use. Your doctor will tell you how much medicine to take. Do not take more medicine than directed. Call emergency for help if you have problems breathing. The medicine will cause constipation. Try to have a bowel movement at least every 2 to 3 days. If you do not have a bowel movement for 3 days, call your doctor or health care professional. Do not take Tylenol (acetaminophen) or medicines that have acetaminophen with this medicine. Too much acetaminophen can be very dangerous. Many nonprescription medicines contain acetaminophen. Always read the labels carefully to avoid taking more acetaminophen. What side effects may I notice from receiving this medicine? Side effects that you should report to your doctor or health care professional as soon as possible: -allergic reactions like skin rash, itching or hives, swelling of the face, lips, or tongue -breathing difficulties, wheezing -confusion -light headedness or fainting spells -severe stomach pain -unusually weak or tired -yellowing of the skin or the whites of the eyes Side effects that usually do not require medical attention (report to your doctor or health care professional if they continue or are bothersome): -dizziness -drowsiness -nausea -vomiting This list may not describe all possible side effects. Call your doctor for medical advice about side effects. You may report side effects to FDA at 1-800-FDA-1088. Where should I keep my  medicine? Keep out of the reach of children. This medicine can be abused. Keep your medicine in a safe place to protect it from theft. Do not share this medicine with anyone. Selling or giving away this medicine is dangerous and against the law. This medicine may cause accidental overdose and death if it taken by other adults, children, or pets. Mix any unused medicine with a substance like cat litter or coffee grounds. Then throw the medicine away in a sealed container like a sealed bag or a coffee can with a lid. Do not use the medicine after the expiration date. Store at room temperature between 20 and 25 degrees C (68 and 77 degrees F). NOTE: This sheet is a summary. It may not cover all possible information. If you have questions about this medicine, talk to your doctor, pharmacist, or health care provider.    2016, Elsevier/Gold Standard. (2014-06-29 15:18:46)

## 2015-10-01 NOTE — ED Provider Notes (Signed)
CSN: [id number]     Arrival date & time 10/01/15  1104 History   First MD Initiated Contact with Patient 10/01/15 1539     Chief Complaint  Patient presents with  . Facial Swelling  . Oral Swelling     (Consider location/radiation/quality/duration/timing/severity/associated sxs/prior Treatment) The history is provided by the patient.  60 -year-old male comes in with pain and swelling on the right side of his face and neck. About 4 weeks ago, he had two infected teeth on the right side pulled. He was having some swelling which did not code down so his dentist pulled another tooth and put him on antibiotics. Symptoms did not improve and he went to urgent care yesterday where he was given a steroid injection and an injection of an antibiotic. Antibiotic was switched from amoxicillin to Augmentin. He initially felt better after the injection, but today had worse. There is pain and swelling around the right year and in the right submandibular area. He rates pain at 9/10. He denies fever or chills. Pain is worse when he applies heat and mildly worse with chewing. He has not noticed any drainage. Has not had any difficulty with hearing. He states that the urgent care physician told him he was concerned about malignant mastoiditis.  Past Medical History  Diagnosis Date  . Hypertension   . Allergy   . Arthritis   . Myocardial infarction [facility])    Past Surgical History  Procedure Laterality Date  . Left heart catheterization with coronary angiogram N/A 07/06/2012    Procedure: LEFT HEART CATHETERIZATION WITH CORONARY ANGIOGRAM;  Surgeon: Robynn Pane, MD;  Location: [facility] CATH LAB;  Service: Cardiovascular;  Laterality: N/A;  . Multiple tooth extractions     Family History  Problem Relation Age of Onset  . Cancer Mother   . Heart disease Mother   . Hyperlipidemia Mother   . Stroke Mother   . Heart disease Father   . Hypertension Father   . Stroke Maternal Grandmother   . Stroke Maternal  Grandfather   . Cancer Paternal Grandfather    Social History  Substance Use Topics  . Smoking status: Former Smoker -- 1.00 packs/day for 3 years    Types: Cigarettes    Quit date: 07/08/2012  . Smokeless tobacco: None  . Alcohol Use: 0.6 oz/week    1 Cans of beer per week    Review of Systems  All other systems reviewed and are negative.     Allergies  Review of patient's allergies indicates no known allergies.  Home Medications   Prior to Admission medications   Medication Sig Start Date End Date Taking? Authorizing Provider  aspirin EC 81 MG EC tablet Take 1 tablet (81 mg total) by mouth daily. 07/08/12   Rinaldo Cloud, MD  atorvastatin (LIPITOR) 80 MG tablet Take 1 tablet (80 mg total) by mouth daily at 6 PM. 07/08/12   Rinaldo Cloud, MD  clopidogrel (PLAVIX) 75 MG tablet Take 75 mg by mouth daily.    Historical Provider, MD  HYDROcodone-acetaminophen (NORCO) 5-325 MG per tablet Take 1 tablet by mouth every 6 (six) hours as needed. 03/16/14   Collene Gobble, MD  metoprolol succinate (TOPROL XL) 25 MG 24 hr tablet Take 0.5 tablets (12.5 mg total) by mouth daily. 07/08/12   Rinaldo Cloud, MD  nitroGLYCERIN (NITROSTAT) 0.4 MG SL tablet Place 1 tablet (0.4 mg total) under the tongue every 5 (five) minutes x 3 doses as needed for chest pain. 07/08/12  Rinaldo Cloud, MD  predniSONE (DELTASONE) 10 MG tablet Takes 6 a day for one day 5 a day for one day for a day for one day 3 a day for one day to day for one day one a day for one 03/16/14   Collene Gobble, MD  ramipril (ALTACE) 2.5 MG capsule Take 1 capsule (2.5 mg total) by mouth daily. 07/08/12   Rinaldo Cloud, MD   BP 142/95 mmHg  Pulse 72  Temp(Src) 97.6 F (36.4 C) (Oral)  Resp 20  SpO2 97% Physical Exam  Nursing note and vitals reviewed.  60 year old male, resting comfortably and in no acute distress. Vital signs are significant for hypertension. Oxygen saturation is 97%, which is normal. Head is normocephalic and  atraumatic. PERRLA, EOMI. Oropharynx is clear. Sockets from pulled teeth on the right side appear to be healing well. No gingival swelling, erythema, pallor and no drainage. There is slight swelling of the right preauricular area and extending into the right submandibular area where the swelling is moderate. There is tenderness to palpation throughout the area involving the right side of mandible, right TMJ, right pre-and postauricular areas. Pain is elicited when tension is applied to the helix of the right ear. There is slight swelling of the right external auditory canal but no drainage. Both tympanic membranes appear normal. Neck is nontender and supple without adenopathy or JVD. Back is nontender and there is no CVA tenderness. Lungs are clear without rales, wheezes, or rhonchi. Chest is nontender. Heart has regular rate and rhythm without murmur. Abdomen is soft, flat, nontender without masses or hepatosplenomegaly and peristalsis is normoactive. Extremities have no cyanosis or edema, full range of motion is present. Skin is warm and dry without rash. Neurologic: Mental status is normal, cranial nerves are intact, there are no motor or sensory deficits.   ED Course  Procedures (including critical care time) Labs Review Results for orders placed or performed during the hospital encounter of 10/01/15  Basic metabolic panel  Result Value Ref Range   Sodium 139 135 - 145 mmol/L   Potassium 3.6 3.5 - 5.1 mmol/L   Chloride 103 101 - 111 mmol/L   CO2 26 22 - 32 mmol/L   Glucose, Bld 95 65 - 99 mg/dL   BUN 22 (H) 6 - 20 mg/dL   Creatinine, Ser 1.61 0.61 - 1.24 mg/dL   Calcium 8.8 (L) 8.9 - 10.3 mg/dL   GFR calc non Af Amer >60 >60 mL/min   GFR calc Af Amer >60 >60 mL/min   Anion gap 10 5 - 15  CBC with Differential  Result Value Ref Range   WBC 9.1 4.0 - 10.5 K/uL   RBC 5.28 4.22 - 5.81 MIL/uL   Hemoglobin 15.7 13.0 - 17.0 g/dL   HCT 09.6 04.5 - 40.9 %   MCV 87.7 78.0 - 100.0 fL    MCH 29.7 26.0 - 34.0 pg   MCHC 33.9 30.0 - 36.0 g/dL   RDW 81.1 91.4 - 78.2 %   Platelets 283 150 - 400 K/uL   Neutrophils Relative % 63 %   Neutro Abs 5.8 1.7 - 7.7 K/uL   Lymphocytes Relative 25 %   Lymphs Abs 2.3 0.7 - 4.0 K/uL   Monocytes Relative 11 %   Monocytes Absolute 1.0 0.1 - 1.0 K/uL   Eosinophils Relative 1 %   Eosinophils Absolute 0.1 0.0 - 0.7 K/uL   Basophils Relative 0 %   Basophils Absolute 0.0 0.0 -  0.1 K/uL   Imaging Review Ct Maxillofacial W/cm  10/01/2015  CLINICAL DATA:  Facial pain and swelling. Dental abscess with 2 tooth extractions 9 days ago. Right-sided facial/ neck swelling and pain. EXAM: CT MAXILLOFACIAL WITH CONTRAST TECHNIQUE: Multidetector CT imaging of the maxillofacial structures was performed with intravenous contrast. Multiplanar CT image reconstructions were also generated. A small metallic BB was placed on the right temple in order to reliably differentiate right from left. CONTRAST:  80mL OMNIPAQUE IOHEXOL 300 MG/ML  SOLN COMPARISON:  None. FINDINGS: Visualized intracranial structures are within normal. Orbits are normal and symmetric. Paranasal sinuses and mastoid air cells are clear. Salivary glands are normal and symmetric. No evidence of adenopathy. Pharynx and hypopharynx are normal. Epiglottis and visualized subglottic airway are normal. Dental carie over the upper right posterior molar tooth. Evidence of patient's recent extractions of 2 adjacent right lower molar teeth. There is a tiny well-defined crescentic subperiosteal fluid collection adjacent the right body of mandible measuring 0.4 x 1.4 cm. This is adjacent to the recent tooth extraction and may be partly postsurgical, although cannot exclude small subperiosteal abscess. Remainder the exam is unremarkable. IMPRESSION: Evidence of patient's recent right lower molar tooth extractions. Tiny crescentic subperiosteal fluid collection along the body of the right mandible adjacent the extraction  site measuring 0.4 x 1.4 cm. Although this may be partly postsurgical, cannot exclude a small subperiosteal abscess. Dental carie upper right posterior molar tooth. Electronically Signed   By: Elberta Fortis M.D.   On: 10/01/2015 18:13   I have personally reviewed and evaluated these images and lab results as part of my medical decision-making.  MDM   Final diagnoses:  Facial infection    Facial infection which has failed to respond to a course of amoxicillin. Patient does not appear toxic in any way. However, given his failure to respond to 2 courses of antibiotics, will send for CT maxillofacial. At this point, he has not had an adequate course of Augmentin to assess whether he is going to respond to it or not.  CT showed questionable area of abscess in the mandible. With the patient afebrile and normal WBC with normal differential, I don't feel that this needs to be addressed in an urgent manner. He is given a dose of clindamycin and will be switched from Augmentin to clindamycin and he is referred to ENT for follow-up. Recommended that he continue taking Cortisporin ear drops which were prescribed yesterday.  Dione Booze, MD 10/01/15 [phone]

## 2016-10-16 ENCOUNTER — Other Ambulatory Visit: Payer: Self-pay | Admitting: Cardiology

## 2016-10-16 DIAGNOSIS — R079 Chest pain, unspecified: Secondary | ICD-10-CM

## 2016-11-06 ENCOUNTER — Encounter (HOSPITAL_COMMUNITY): Payer: Managed Care, Other (non HMO)

## 2017-02-11 IMAGING — CT CT MAXILLOFACIAL W/ CM
3 series · 14 of 47 positions shown, 16 images · IV contrast (omnipaque)
Comparison: None.

CLINICAL DATA: Facial pain and swelling. Dental abscess with 2
tooth extractions 9 days ago. Right-sided facial/ neck swelling and
pain.

EXAM:
CT MAXILLOFACIAL WITH CONTRAST
TECHNIQUE: Multidetector CT imaging of the maxillofacial structures was
performed with intravenous contrast. Multiplanar CT image
reconstructions were also generated. A small metallic BB was placed
on the right temple in order to reliably differentiate right from
left.
CONTRAST:  80mL OMNIPAQUE IOHEXOL 300 MG/ML  SOLN

[Series 4: facial st · axial · 0.44mm/px · z∈[+1224,+1374]mm · 8 of 88 slices shown, 10 images]
[im 7/88  brain]
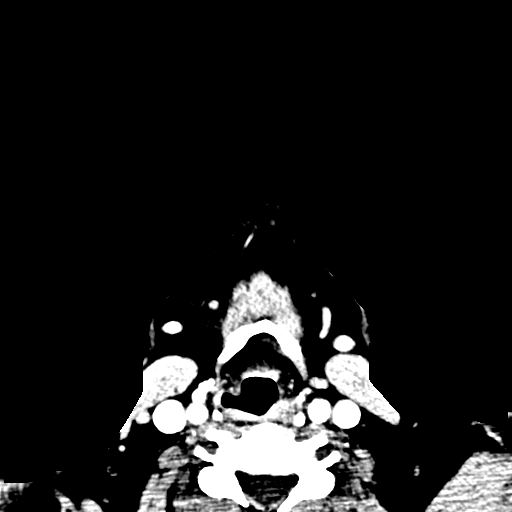
[im 7/88  bone]
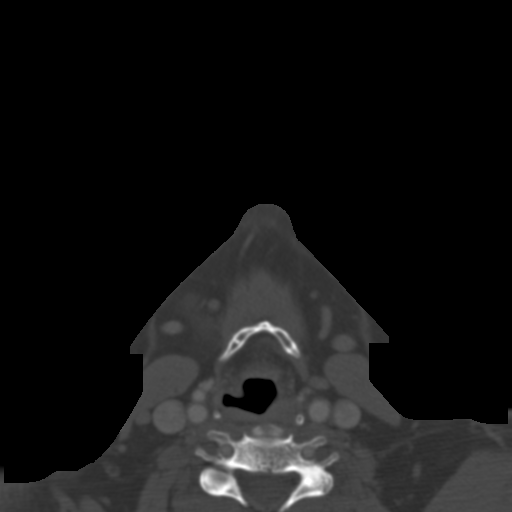
[im 19/88  bone]
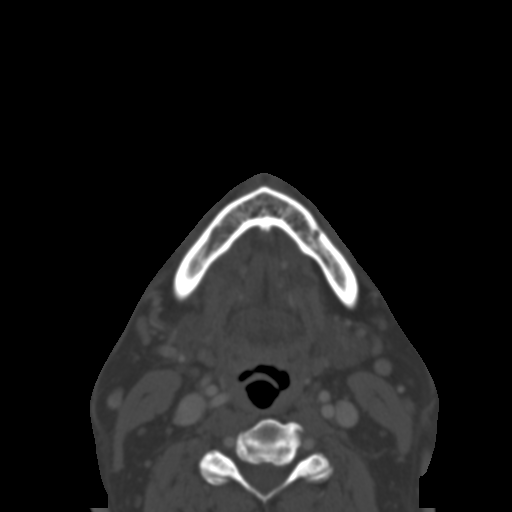
[im 28/88  bone]
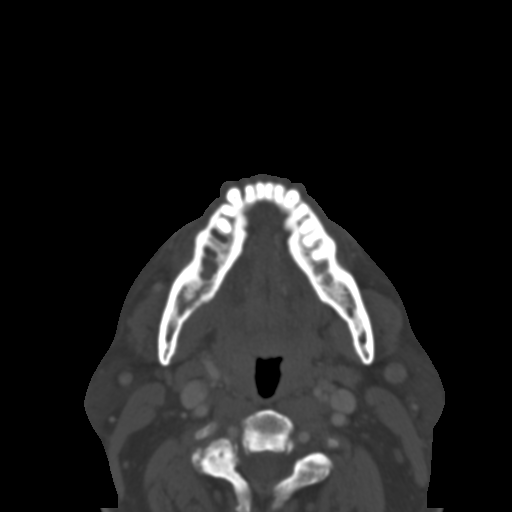
[im 40/88  bone]
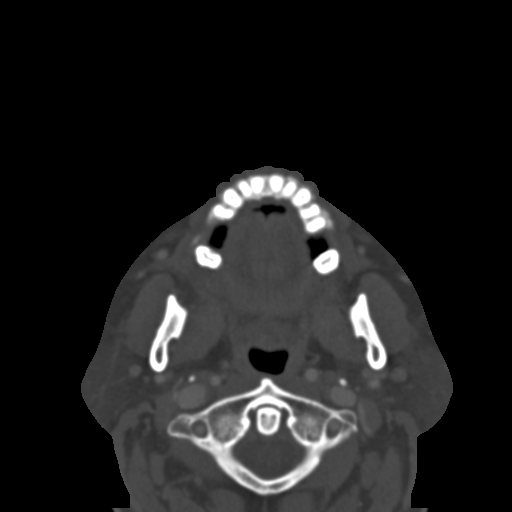
[im 49/88  brain]
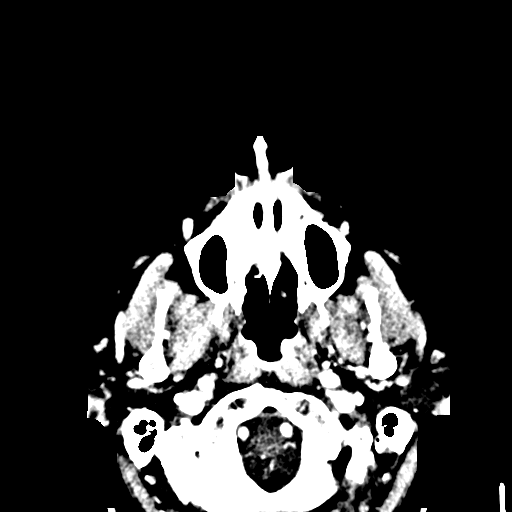
[im 49/88  bone]
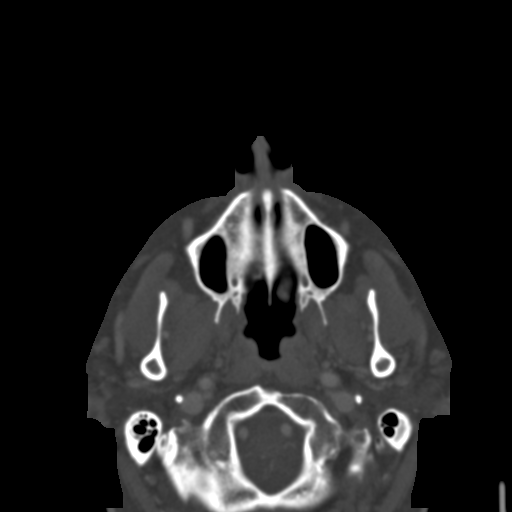
[im 61/88  bone]
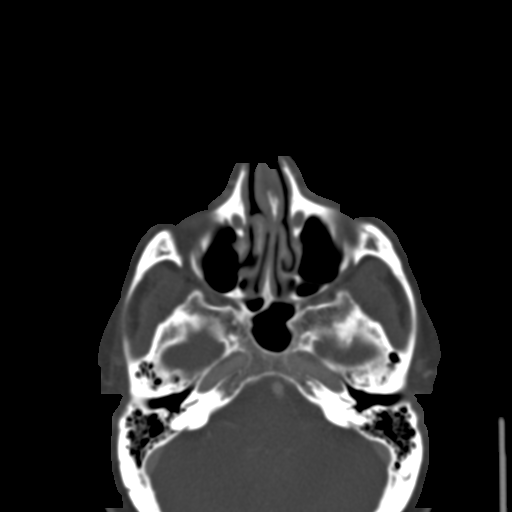
[im 70/88  bone]
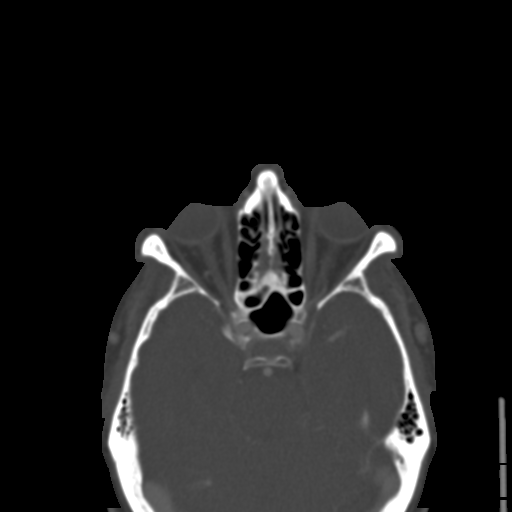
[im 82/88  bone]
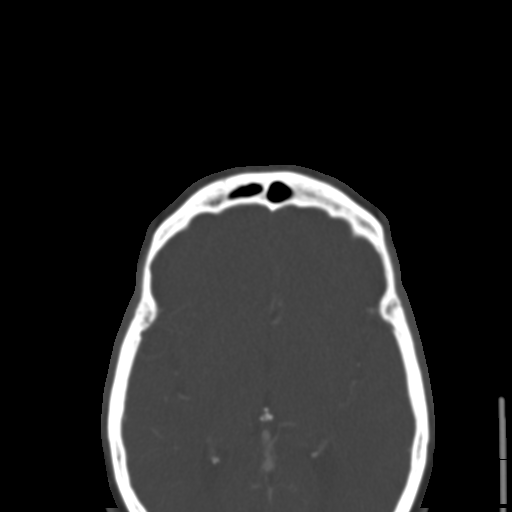

[Series 6: coronal st · coronal · 0.34mm/px · 3 of 91 slices shown]
[im 31/91  bone]
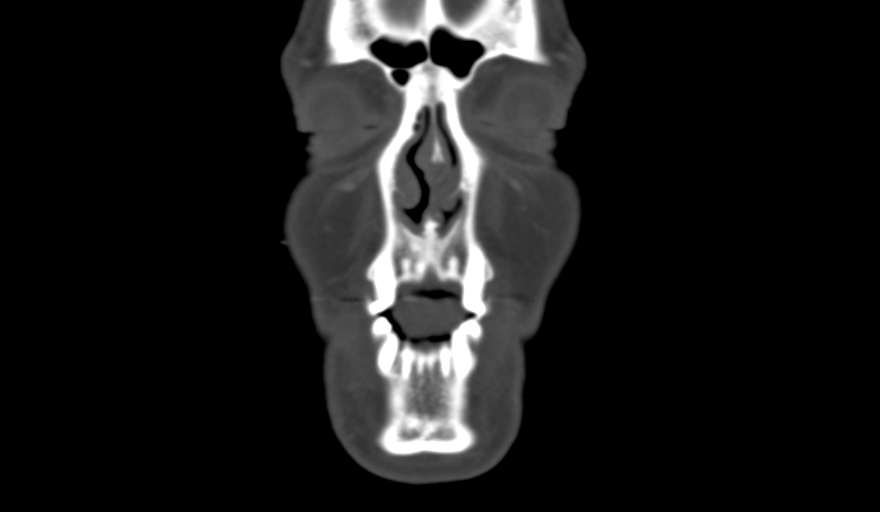
[im 41/91  bone]
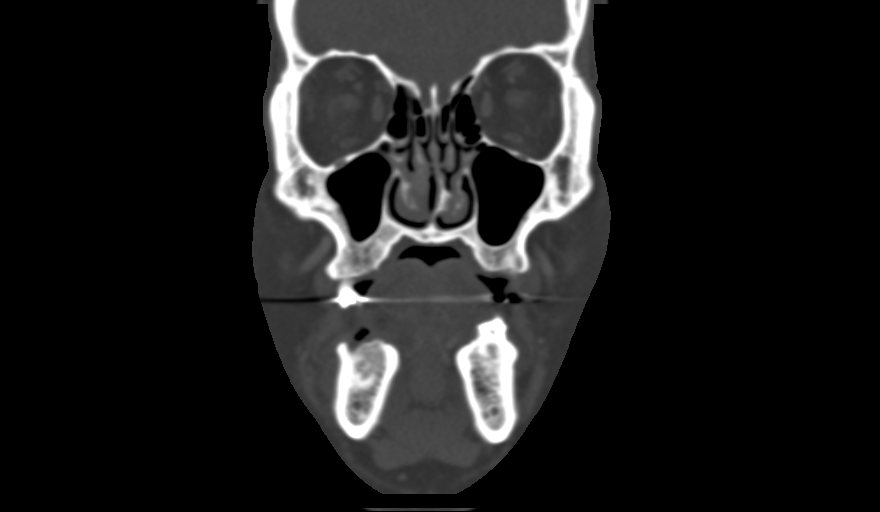
[im 51/91  bone]
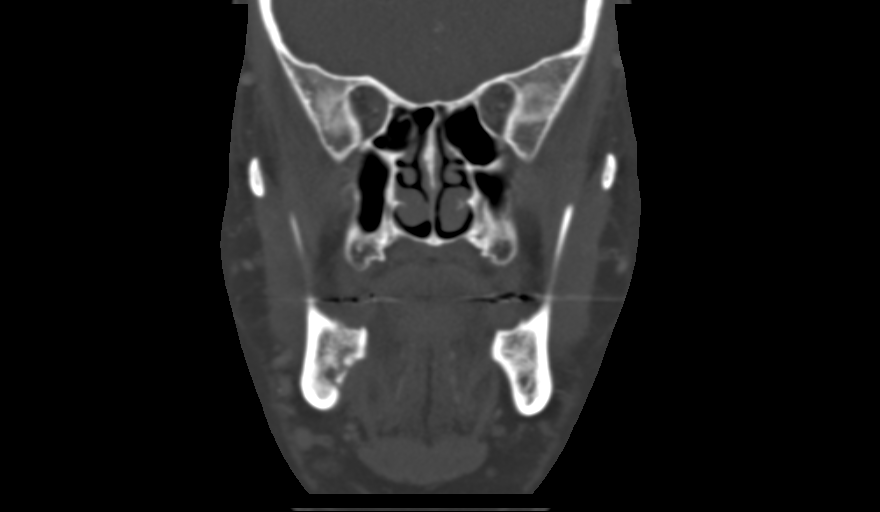

[Series 7: sagittal st · sagittal · 0.34mm/px · 3 of 107 slices shown]
[im 36/107  bone]
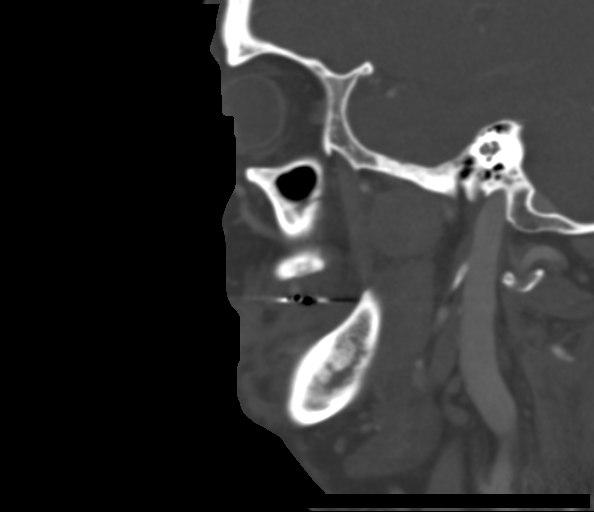
[im 54/107  bone]
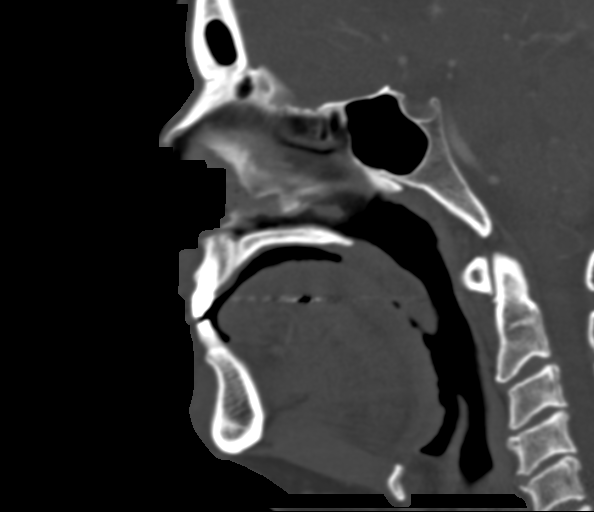
[im 71/107  bone]
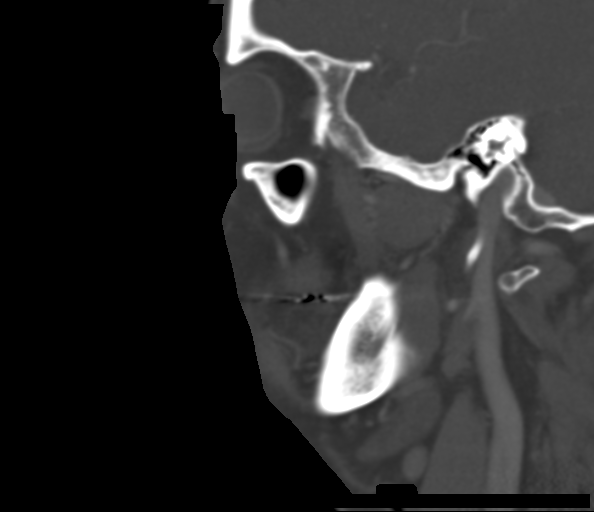

[14 of 47 positions shown; findings below may reference images not displayed]

FINDINGS: Visualized intracranial structures are within normal. Orbits are
normal and symmetric. Paranasal sinuses and mastoid air cells are
clear.

Salivary glands are normal and symmetric. No evidence of adenopathy.
Pharynx and hypopharynx are normal. Epiglottis and visualized
subglottic airway are normal.

Dental [place] over the upper right posterior molar tooth. Evidence of
patient's recent extractions of 2 adjacent right lower molar teeth.
There is a tiny well-defined crescentic subperiosteal fluid
collection adjacent the right body of mandible measuring 0.4 x
cm. This is adjacent to the recent tooth extraction and may be
partly postsurgical, although cannot exclude small subperiosteal
abscess.

Remainder the exam is unremarkable.
IMPRESSION: Evidence of patient's recent right lower molar tooth extractions.
Tiny crescentic subperiosteal fluid collection along the body of the
right mandible adjacent the extraction site measuring 0.4 x 1.4 cm.
Although this may be partly postsurgical, cannot exclude a small
subperiosteal abscess.

Dental Ashwayne upper right posterior molar tooth.

## 2021-11-15 DIAGNOSIS — H16141 Punctate keratitis, right eye: Secondary | ICD-10-CM | POA: Diagnosis not present

## 2022-01-10 DIAGNOSIS — E785 Hyperlipidemia, unspecified: Secondary | ICD-10-CM | POA: Diagnosis not present

## 2022-01-10 DIAGNOSIS — G4733 Obstructive sleep apnea (adult) (pediatric): Secondary | ICD-10-CM | POA: Diagnosis not present

## 2022-01-10 DIAGNOSIS — I2510 Atherosclerotic heart disease of native coronary artery without angina pectoris: Secondary | ICD-10-CM | POA: Diagnosis not present

## 2022-01-10 DIAGNOSIS — I10 Essential (primary) hypertension: Secondary | ICD-10-CM | POA: Diagnosis not present

## 2022-01-14 DIAGNOSIS — E785 Hyperlipidemia, unspecified: Secondary | ICD-10-CM | POA: Diagnosis not present

## 2022-01-14 DIAGNOSIS — I10 Essential (primary) hypertension: Secondary | ICD-10-CM | POA: Diagnosis not present

## 2022-01-14 DIAGNOSIS — I2510 Atherosclerotic heart disease of native coronary artery without angina pectoris: Secondary | ICD-10-CM | POA: Diagnosis not present

## 2022-01-25 DIAGNOSIS — I10 Essential (primary) hypertension: Secondary | ICD-10-CM | POA: Diagnosis not present

## 2022-01-25 DIAGNOSIS — E119 Type 2 diabetes mellitus without complications: Secondary | ICD-10-CM | POA: Diagnosis not present

## 2022-01-25 DIAGNOSIS — E785 Hyperlipidemia, unspecified: Secondary | ICD-10-CM | POA: Diagnosis not present

## 2022-04-17 DIAGNOSIS — E785 Hyperlipidemia, unspecified: Secondary | ICD-10-CM | POA: Diagnosis not present

## 2022-04-17 DIAGNOSIS — I2510 Atherosclerotic heart disease of native coronary artery without angina pectoris: Secondary | ICD-10-CM | POA: Diagnosis not present

## 2022-04-17 DIAGNOSIS — I10 Essential (primary) hypertension: Secondary | ICD-10-CM | POA: Diagnosis not present

## 2022-04-17 DIAGNOSIS — E119 Type 2 diabetes mellitus without complications: Secondary | ICD-10-CM | POA: Diagnosis not present

## 2022-07-17 DIAGNOSIS — E785 Hyperlipidemia, unspecified: Secondary | ICD-10-CM | POA: Diagnosis not present

## 2022-07-17 DIAGNOSIS — I2510 Atherosclerotic heart disease of native coronary artery without angina pectoris: Secondary | ICD-10-CM | POA: Diagnosis not present

## 2022-07-17 DIAGNOSIS — G4733 Obstructive sleep apnea (adult) (pediatric): Secondary | ICD-10-CM | POA: Diagnosis not present

## 2022-07-17 DIAGNOSIS — I10 Essential (primary) hypertension: Secondary | ICD-10-CM | POA: Diagnosis not present

## 2022-07-19 DIAGNOSIS — E119 Type 2 diabetes mellitus without complications: Secondary | ICD-10-CM | POA: Diagnosis not present

## 2022-07-19 DIAGNOSIS — I2510 Atherosclerotic heart disease of native coronary artery without angina pectoris: Secondary | ICD-10-CM | POA: Diagnosis not present

## 2022-07-19 DIAGNOSIS — I10 Essential (primary) hypertension: Secondary | ICD-10-CM | POA: Diagnosis not present

## 2022-07-19 DIAGNOSIS — E785 Hyperlipidemia, unspecified: Secondary | ICD-10-CM | POA: Diagnosis not present

## 2022-07-31 ENCOUNTER — Emergency Department (HOSPITAL_COMMUNITY)
Admission: EM | Admit: 2022-07-31 | Discharge: 2022-07-31 | Disposition: A | Discharge status: Home / Self Care | Payer: Medicare Other | Attending: Physician Assistant | Admitting: Physician Assistant

## 2022-07-31 ENCOUNTER — Emergency Department (HOSPITAL_COMMUNITY): Payer: Medicare Other

## 2022-07-31 ENCOUNTER — Encounter (HOSPITAL_COMMUNITY): Payer: Self-pay

## 2022-07-31 DIAGNOSIS — S51831A Puncture wound without foreign body of right forearm, initial encounter: Secondary | ICD-10-CM | POA: Diagnosis not present

## 2022-07-31 DIAGNOSIS — Z7982 Long term (current) use of aspirin: Secondary | ICD-10-CM | POA: Insufficient documentation

## 2022-07-31 DIAGNOSIS — Z2914 Encounter for prophylactic rabies immune globin: Secondary | ICD-10-CM | POA: Insufficient documentation

## 2022-07-31 DIAGNOSIS — Z203 Contact with and (suspected) exposure to rabies: Secondary | ICD-10-CM | POA: Insufficient documentation

## 2022-07-31 DIAGNOSIS — W540XXA Bitten by dog, initial encounter: Secondary | ICD-10-CM | POA: Insufficient documentation

## 2022-07-31 DIAGNOSIS — Z23 Encounter for immunization: Secondary | ICD-10-CM | POA: Insufficient documentation

## 2022-07-31 DIAGNOSIS — Z79899 Other long term (current) drug therapy: Secondary | ICD-10-CM | POA: Diagnosis not present

## 2022-07-31 DIAGNOSIS — I10 Essential (primary) hypertension: Secondary | ICD-10-CM | POA: Diagnosis not present

## 2022-07-31 DIAGNOSIS — S61551A Open bite of right wrist, initial encounter: Secondary | ICD-10-CM | POA: Diagnosis not present

## 2022-07-31 DIAGNOSIS — S51851A Open bite of right forearm, initial encounter: Secondary | ICD-10-CM | POA: Diagnosis not present

## 2022-07-31 DIAGNOSIS — S61431A Puncture wound without foreign body of right hand, initial encounter: Secondary | ICD-10-CM | POA: Diagnosis not present

## 2022-07-31 DIAGNOSIS — S61451A Open bite of right hand, initial encounter: Secondary | ICD-10-CM | POA: Diagnosis not present

## 2022-07-31 MED ORDER — TETANUS-DIPHTH-ACELL PERTUSSIS 5-2.5-18.5 LF-MCG/0.5 IM SUSY
0.5000 mL | PREFILLED_SYRINGE | Freq: Once | INTRAMUSCULAR | Status: AC
  Administered 2022-07-31: 0.5 mL via INTRAMUSCULAR
  Filled 2022-07-31: qty 0.5

## 2022-07-31 MED ORDER — RABIES IMMUNE GLOBULIN 150 UNIT/ML IM INJ
20.0000 [IU]/kg | INJECTION | Freq: Once | INTRAMUSCULAR | Status: AC
  Administered 2022-07-31: 2250 [IU] via INTRAMUSCULAR
  Filled 2022-07-31: qty 20

## 2022-07-31 MED ORDER — AMOXICILLIN-POT CLAVULANATE 875-125 MG PO TABS: 1.0000 | ORAL_TABLET | Freq: Two times a day (BID) | ORAL | 0 refills | Status: AC

## 2022-07-31 MED ORDER — RABIES VACCINE, PCEC IM SUSR
1.0000 mL | Freq: Once | INTRAMUSCULAR | Status: AC
  Administered 2022-07-31: 1 mL via INTRAMUSCULAR
  Filled 2022-07-31: qty 1

## 2022-07-31 MED ORDER — AMOXICILLIN-POT CLAVULANATE 875-125 MG PO TABS
1.0000 | ORAL_TABLET | Freq: Once | ORAL | Status: AC
  Administered 2022-07-31: 1 via ORAL
  Filled 2022-07-31: qty 1

## 2022-07-31 NOTE — Discharge Instructions (Addendum)
                                  RABIES VACCINE FOLLOW UP  Patient's Name: Mason Mcdonald                     Original Order Date:07/31/2022  Medical Record Number: [id number]  ED Physician: No att. providers found Primary Diagnosis: Rabies Exposure       PCP: Rinaldo Cloud, MD  Patient Phone Number: (home) [phone] (home)    (cell)  Telephone Information:  Mobile [phone]    (work) There is no work phone number on file. Species of Animal:     You have been seen in the [facility] for a possible rabies exposure. You must return for the additional vaccine doses and if needed, your immonuglobulin injections, as scheduled above.  Your Vaccine Injections will be given at the [facility]) at [facility].  The Urgent [facility] is open from 8:00AM-9:00PM Monday thru Friday and 10:00AM-9:00PM on Saturdays and Sundays.  Please call [phone] Urgent Care Center, if you have any problems with making your scheduled appointments.  This will assure you prompt attention on your arrival and allow [place] to be prepared for your return visit.  There will be a minimal fee for the injection that will be billed to your insurance company along with the charge for the vaccine.   Clinic that will administer your rabies vaccines:    DAY 0:  07/31/2022      DAY 3:  08/03/2022       DAY 7:  08/07/2022     DAY 14:  08/14/2022         The 5th vaccine injection is considered for immune compromised patients only.  DAY 28:  08/28/2022      You are also welcome to return to the ER if needed to complete your vaccine series.   Your x-rays today did not show any foreign bodies or fractures.  Please take the antibiotic as directed until finished.  Your tetanus shot was updated today.  Please make sure to return to the ER if you have worsening pain, swelling, fevers, chills, inability to move your joints or any other new or concerning symptoms

## 2022-07-31 NOTE — ED Provider Notes (Signed)
[facility]-EMERGENCY DEPT Provider Note   CSN: [id number] Arrival date & time: 07/31/22  9147     History  Chief Complaint  Patient presents with   Animal Bite    Mason Mcdonald is a 66 y.o. male.  HPI 66 year old male with history of hypertension, MI, allergies and arthritis presents to the ER with complaints of multiple dog bite to the right forearm/hand which occurred on Monday.  He talked animal control and the dogs are not up-to-date on their rabies vaccines.  He reports increasing swelling and pain to the arm over the last several days.  Denies any fevers.  He is unclear when his last tetanus shot was.    Home Medications Prior to Admission medications   Medication Sig Start Date End Date Taking? Authorizing Provider  amoxicillin-clavulanate (AUGMENTIN) 875-125 MG tablet Take 1 tablet by mouth every 12 (twelve) hours for 7 days. 07/31/22 08/07/22 Yes Mare Ferrari, PA-C  aspirin EC 325 MG tablet Take 325 mg by mouth daily as needed for mild pain.    [provider]  aspirin EC 81 MG EC tablet Take 1 tablet (81 mg total) by mouth daily. 07/08/12   Rinaldo Cloud, MD  atorvastatin (LIPITOR) 80 MG tablet Take 1 tablet (80 mg total) by mouth daily at 6 PM. 07/08/12   Rinaldo Cloud, MD  clindamycin (CLEOCIN) 300 MG capsule Take 1 capsule (300 mg total) by mouth 4 (four) times daily. X 7 days 10/01/15   Dione Booze, MD  HYDROcodone-acetaminophen [facility]) 7.5-325 MG tablet Take 1-2 tablets by mouth every 6 (six) hours as needed for moderate pain.    [provider]  metoprolol succinate (TOPROL XL) 25 MG 24 hr tablet Take 0.5 tablets (12.5 mg total) by mouth daily. Patient taking differently: Take 25 mg by mouth daily.  07/08/12   Rinaldo Cloud, MD  neomycin-polymyxin-hydrocortisone (CORTISPORIN) 3.5-10000-1 otic suspension Place 4 drops into the right ear 4 (four) times daily. 09/30/15   [provider]  nitroGLYCERIN (NITROSTAT) 0.4  MG SL tablet Place 1 tablet (0.4 mg total) under the tongue every 5 (five) minutes x 3 doses as needed for chest pain. 07/08/12   Rinaldo Cloud, MD  oxyCODONE-acetaminophen (PERCOCET) 5-325 MG tablet Take 1 tablet by mouth every 4 (four) hours as needed for moderate pain. 10/01/15   Dione Booze, MD  ramipril (ALTACE) 5 MG capsule Take 5 mg by mouth daily. 09/08/15   [provider]      Allergies    Patient has no known allergies.    Review of Systems   Review of Systems Ten systems reviewed and are negative for acute change, except as noted in the HPI.   Physical Exam Updated Vital Signs BP (!) 159/89 (BP Location: Left Arm)   Pulse 70   Temp 98.9 F (37.2 C) (Oral)   Resp 18   Wt 112.5 kg   SpO2 93%   BMI 32.28 kg/m  Physical Exam Skin:    Comments: Multiple lumbar patient/puncture wounds to the right hand/wrist/forearm.  No palpable abscess or drainage.  He has some pain with flexion and extension of the wrist but range of motion is preserved.  5/5 grip strength.     ED Results / Procedures / Treatments   Labs (all labs ordered are listed, but only abnormal results are displayed) Labs Reviewed - No data to display  EKG None  Radiology DG Forearm Right  Result Date: 07/31/2022 CLINICAL DATA:  Dog bite. EXAM:  RIGHT FOREARM - 2 VIEW COMPARISON:  None Available. FINDINGS: There is no evidence of fracture or other focal bone lesions. Soft tissues are unremarkable. IMPRESSION: Negative. Electronically Signed   By: Lupita Raider M.D.   On: 07/31/2022 10:36   DG Wrist Complete Right  Result Date: 07/31/2022 CLINICAL DATA:  Dog bite. EXAM: RIGHT WRIST - COMPLETE 3+ VIEW COMPARISON:  None Available. FINDINGS: There is no evidence of fracture or dislocation. There is no evidence of arthropathy or other focal bone abnormality. Soft tissues are unremarkable. IMPRESSION: Negative. Electronically Signed   By: Lupita Raider M.D.   On: 07/31/2022 10:35   DG Hand  Complete Right  Result Date: 07/31/2022 CLINICAL DATA:  Dog bite. EXAM: RIGHT HAND - COMPLETE 3+ VIEW COMPARISON:  None Available. FINDINGS: There is no evidence of fracture or dislocation. There is no evidence of arthropathy or other focal bone abnormality. Soft tissues are unremarkable. IMPRESSION: Negative. Electronically Signed   By: Lupita Raider M.D.   On: 07/31/2022 10:34    Procedures Procedures    Medications Ordered in ED Medications  rabies immune globulin (HYPERRAB/KEDRAB) injection 2,250 Units (2,250 Units Intramuscular Given 07/31/22 1058)  rabies vaccine (RABAVERT) injection 1 mL (1 mL Intramuscular Given 07/31/22 1051)  Tdap (BOOSTRIX) injection 0.5 mL (0.5 mLs Intramuscular Given 07/31/22 1057)  amoxicillin-clavulanate (AUGMENTIN) 875-125 MG per tablet 1 tablet (1 tablet Oral Given 07/31/22 1050)    ED Course/ Medical Decision Making/ A&P                           Medical Decision Making Amount and/or Complexity of Data Reviewed Radiology: ordered.  Risk Prescription drug management.   66 year old male who presents to the ER with multiple dog bites.Patient unclear when his last tetanus shot was.  He does have some mild swelling to the area of the bite, however no visible abscesses or drainage.  No significant no evidence of cellulitis, sepsis.  No evidence of septic joint on physical exam.  He received an updated tetanus shot, rabies series initiated with follow-up provided, and will start on Augmentin.  I encouraged him to continue to keep his wounds clean and dry.  We discussed signs of infection and return precautions.  He voiced understanding and is agreeable.  Stable for discharge. Final Clinical Impression(s) / ED Diagnoses Final diagnoses:  Dog bite, initial encounter  Rabies, need for prophylactic vaccination against    Rx / DC Orders ED Discharge Orders          Ordered    amoxicillin-clavulanate (AUGMENTIN) 875-125 MG tablet  Every 12 hours         07/31/22 1124              Leone Brand 07/31/22 1126    Alvira Monday, MD 08/01/22 [phone]

## 2022-07-31 NOTE — ED Provider Triage Note (Signed)
Emergency Medicine Provider Triage Evaluation Note  Mason Mcdonald , a 66 y.o. male  was evaluated in triage.  Pt complains of dog bite to the right hand, wrist, forearm.  Bites occurred on Monday.  He contacted animal control, dogs are not up-to-date on the rabies vaccines.  He reports increasing swelling to the hand, wrist and forearm since Monday.  Has some pain and ranging wrist and elbow but is still able to do so.  He has been putting Neosporin on the bites.  Review of Systems  Positive: As above Negative: As above  Physical Exam  BP (!) 159/89 (BP Location: Left Arm)   Pulse 70   Temp 98.9 F (37.2 C) (Oral)   Resp 18   Wt 112.5 kg   SpO2 93%   BMI 32.28 kg/m  Gen:   Awake, no distress   Resp:  Normal effort  MSK:   Moves extremities without difficulty  Other:  Multiple puncture wounds to the right hand, wrist area and right forearm.  Mild swelling and mild erythema.  Fluctuance, discharge.  Full flexion and extension of the right wrist and elbow.  Medical Decision Making  Medically screening exam initiated at 10:07 AM.  Appropriate orders placed.  Mathews Robinsons was informed that the remainder of the evaluation will be completed by another provider, this initial triage assessment does not replace that evaluation, and the importance of remaining in the ED until their evaluation is complete.  Plan to obtain x-rays to rule out any fracture, rabies series ordered.  Will update tetanus and give first dose of antibiotics here.  No evidence of sepsis.   Mare Ferrari, PA-C 07/31/22 1008

## 2022-07-31 NOTE — ED Triage Notes (Signed)
Pt arrived via POV, bite by 2 different dogs Monday. Multiple puncture wounds to right arm/leg. States one of the dogs is unvaccinated.

## 2022-08-03 ENCOUNTER — Ambulatory Visit (HOSPITAL_COMMUNITY)
Admission: EM | Admit: 2022-08-03 | Discharge: 2022-08-03 | Disposition: A | Discharge status: Home / Self Care | Payer: Medicare Other | Attending: Internal Medicine | Admitting: Internal Medicine

## 2022-08-03 ENCOUNTER — Encounter (HOSPITAL_COMMUNITY): Payer: Self-pay | Admitting: Emergency Medicine

## 2022-08-03 DIAGNOSIS — Z203 Contact with and (suspected) exposure to rabies: Secondary | ICD-10-CM

## 2022-08-03 MED ORDER — RABIES VACCINE, PCEC IM SUSR
1.0000 mL | Freq: Once | INTRAMUSCULAR | Status: AC
  Administered 2022-08-03: 1 mL via INTRAMUSCULAR

## 2022-08-03 MED ORDER — RABIES VACCINE, PCEC IM SUSR
INTRAMUSCULAR | Status: AC
  Filled 2022-08-03: qty 1

## 2022-08-03 NOTE — ED Triage Notes (Signed)
Pt reports here to get rabies vaccination. Denies any complication from first set of vaccination or dog bites.

## 2022-08-07 ENCOUNTER — Ambulatory Visit (HOSPITAL_COMMUNITY)
Admission: EM | Admit: 2022-08-07 | Discharge: 2022-08-07 | Disposition: A | Discharge status: Home / Self Care | Payer: Medicare Other | Attending: Internal Medicine | Admitting: Internal Medicine

## 2022-08-07 ENCOUNTER — Encounter (HOSPITAL_COMMUNITY): Payer: Self-pay

## 2022-08-07 DIAGNOSIS — Z203 Contact with and (suspected) exposure to rabies: Secondary | ICD-10-CM | POA: Diagnosis not present

## 2022-08-07 MED ORDER — RABIES VACCINE, PCEC IM SUSR
INTRAMUSCULAR | Status: AC
  Filled 2022-08-07: qty 1

## 2022-08-07 MED ORDER — RABIES VACCINE, PCEC IM SUSR
1.0000 mL | Freq: Once | INTRAMUSCULAR | Status: AC
  Administered 2022-08-07: 1 mL via INTRAMUSCULAR

## 2022-08-07 NOTE — ED Triage Notes (Signed)
Pt here to get 3rd rabies vaccination. Denies any problems or complications.

## 2022-08-14 ENCOUNTER — Ambulatory Visit (HOSPITAL_COMMUNITY)
Admission: EM | Admit: 2022-08-14 | Discharge: 2022-08-14 | Disposition: A | Discharge status: Home / Self Care | Payer: BLUE CROSS/BLUE SHIELD | Attending: Internal Medicine | Admitting: Internal Medicine

## 2022-08-14 DIAGNOSIS — Z203 Contact with and (suspected) exposure to rabies: Secondary | ICD-10-CM | POA: Diagnosis not present

## 2022-08-14 MED ORDER — RABIES VACCINE, PCEC IM SUSR
INTRAMUSCULAR | Status: AC
  Filled 2022-08-14: qty 1

## 2022-08-14 MED ORDER — RABIES VACCINE, PCEC IM SUSR
1.0000 mL | Freq: Once | INTRAMUSCULAR | Status: AC
  Administered 2022-08-14: 1 mL via INTRAMUSCULAR

## 2022-08-14 NOTE — ED Notes (Signed)
Patient presenting for day 14 rabies immunization.

## 2022-08-14 NOTE — ED Notes (Signed)
Medication administered in the left deltoid. Patient tolerated well.

## 2022-10-16 DIAGNOSIS — E785 Hyperlipidemia, unspecified: Secondary | ICD-10-CM | POA: Diagnosis not present

## 2022-10-16 DIAGNOSIS — I10 Essential (primary) hypertension: Secondary | ICD-10-CM | POA: Diagnosis not present

## 2022-10-16 DIAGNOSIS — E119 Type 2 diabetes mellitus without complications: Secondary | ICD-10-CM | POA: Diagnosis not present

## 2022-10-16 DIAGNOSIS — I2510 Atherosclerotic heart disease of native coronary artery without angina pectoris: Secondary | ICD-10-CM | POA: Diagnosis not present

## 2023-01-15 DIAGNOSIS — E119 Type 2 diabetes mellitus without complications: Secondary | ICD-10-CM | POA: Diagnosis not present

## 2023-01-15 DIAGNOSIS — I2510 Atherosclerotic heart disease of native coronary artery without angina pectoris: Secondary | ICD-10-CM | POA: Diagnosis not present

## 2023-01-15 DIAGNOSIS — E782 Mixed hyperlipidemia: Secondary | ICD-10-CM | POA: Diagnosis not present

## 2023-01-15 DIAGNOSIS — I10 Essential (primary) hypertension: Secondary | ICD-10-CM | POA: Diagnosis not present

## 2023-01-17 DIAGNOSIS — Z125 Encounter for screening for malignant neoplasm of prostate: Secondary | ICD-10-CM | POA: Diagnosis not present

## 2023-01-17 DIAGNOSIS — I2510 Atherosclerotic heart disease of native coronary artery without angina pectoris: Secondary | ICD-10-CM | POA: Diagnosis not present

## 2023-01-17 DIAGNOSIS — E782 Mixed hyperlipidemia: Secondary | ICD-10-CM | POA: Diagnosis not present

## 2023-01-17 DIAGNOSIS — E118 Type 2 diabetes mellitus with unspecified complications: Secondary | ICD-10-CM | POA: Diagnosis not present

## 2023-01-30 DIAGNOSIS — K080 Exfoliation of teeth due to systemic causes: Secondary | ICD-10-CM | POA: Diagnosis not present

## 2023-02-04 DIAGNOSIS — K080 Exfoliation of teeth due to systemic causes: Secondary | ICD-10-CM | POA: Diagnosis not present

## 2023-02-17 DIAGNOSIS — E119 Type 2 diabetes mellitus without complications: Secondary | ICD-10-CM | POA: Diagnosis not present

## 2023-02-17 DIAGNOSIS — R809 Proteinuria, unspecified: Secondary | ICD-10-CM | POA: Diagnosis not present

## 2023-02-17 DIAGNOSIS — E7800 Pure hypercholesterolemia, unspecified: Secondary | ICD-10-CM | POA: Diagnosis not present

## 2023-03-13 DIAGNOSIS — E119 Type 2 diabetes mellitus without complications: Secondary | ICD-10-CM | POA: Diagnosis not present

## 2023-04-13 DIAGNOSIS — E119 Type 2 diabetes mellitus without complications: Secondary | ICD-10-CM | POA: Diagnosis not present

## 2023-04-16 DIAGNOSIS — I10 Essential (primary) hypertension: Secondary | ICD-10-CM | POA: Diagnosis not present

## 2023-04-16 DIAGNOSIS — E119 Type 2 diabetes mellitus without complications: Secondary | ICD-10-CM | POA: Diagnosis not present

## 2023-04-16 DIAGNOSIS — E782 Mixed hyperlipidemia: Secondary | ICD-10-CM | POA: Diagnosis not present

## 2023-04-16 DIAGNOSIS — I2510 Atherosclerotic heart disease of native coronary artery without angina pectoris: Secondary | ICD-10-CM | POA: Diagnosis not present

## 2023-05-13 DIAGNOSIS — E119 Type 2 diabetes mellitus without complications: Secondary | ICD-10-CM | POA: Diagnosis not present

## 2023-05-28 DIAGNOSIS — G8929 Other chronic pain: Secondary | ICD-10-CM | POA: Diagnosis not present

## 2023-06-13 DIAGNOSIS — E119 Type 2 diabetes mellitus without complications: Secondary | ICD-10-CM | POA: Diagnosis not present

## 2023-07-13 DIAGNOSIS — E119 Type 2 diabetes mellitus without complications: Secondary | ICD-10-CM | POA: Diagnosis not present

## 2023-08-01 DIAGNOSIS — E119 Type 2 diabetes mellitus without complications: Secondary | ICD-10-CM | POA: Diagnosis not present

## 2023-08-01 DIAGNOSIS — E782 Mixed hyperlipidemia: Secondary | ICD-10-CM | POA: Diagnosis not present

## 2023-08-01 DIAGNOSIS — I2510 Atherosclerotic heart disease of native coronary artery without angina pectoris: Secondary | ICD-10-CM | POA: Diagnosis not present

## 2023-08-01 DIAGNOSIS — I10 Essential (primary) hypertension: Secondary | ICD-10-CM | POA: Diagnosis not present

## 2023-08-13 DIAGNOSIS — E119 Type 2 diabetes mellitus without complications: Secondary | ICD-10-CM | POA: Diagnosis not present

## 2023-09-13 DIAGNOSIS — E119 Type 2 diabetes mellitus without complications: Secondary | ICD-10-CM | POA: Diagnosis not present

## 2023-10-08 DIAGNOSIS — Z0000 Encounter for general adult medical examination without abnormal findings: Secondary | ICD-10-CM | POA: Diagnosis not present

## 2023-10-08 DIAGNOSIS — E1129 Type 2 diabetes mellitus with other diabetic kidney complication: Secondary | ICD-10-CM | POA: Diagnosis not present

## 2023-10-08 DIAGNOSIS — E7800 Pure hypercholesterolemia, unspecified: Secondary | ICD-10-CM | POA: Diagnosis not present

## 2023-10-08 DIAGNOSIS — Z125 Encounter for screening for malignant neoplasm of prostate: Secondary | ICD-10-CM | POA: Diagnosis not present

## 2023-10-08 DIAGNOSIS — I10 Essential (primary) hypertension: Secondary | ICD-10-CM | POA: Diagnosis not present

## 2023-10-11 DIAGNOSIS — E119 Type 2 diabetes mellitus without complications: Secondary | ICD-10-CM | POA: Diagnosis not present

## 2023-10-31 DIAGNOSIS — I10 Essential (primary) hypertension: Secondary | ICD-10-CM | POA: Diagnosis not present

## 2023-10-31 DIAGNOSIS — E782 Mixed hyperlipidemia: Secondary | ICD-10-CM | POA: Diagnosis not present

## 2023-10-31 DIAGNOSIS — I2510 Atherosclerotic heart disease of native coronary artery without angina pectoris: Secondary | ICD-10-CM | POA: Diagnosis not present

## 2023-10-31 DIAGNOSIS — E119 Type 2 diabetes mellitus without complications: Secondary | ICD-10-CM | POA: Diagnosis not present

## 2023-11-11 DIAGNOSIS — E119 Type 2 diabetes mellitus without complications: Secondary | ICD-10-CM | POA: Diagnosis not present

## 2023-12-11 DIAGNOSIS — E119 Type 2 diabetes mellitus without complications: Secondary | ICD-10-CM | POA: Diagnosis not present

## 2024-01-14 DIAGNOSIS — E119 Type 2 diabetes mellitus without complications: Secondary | ICD-10-CM | POA: Diagnosis not present

## 2024-01-14 DIAGNOSIS — I2510 Atherosclerotic heart disease of native coronary artery without angina pectoris: Secondary | ICD-10-CM | POA: Diagnosis not present

## 2024-01-14 DIAGNOSIS — I10 Essential (primary) hypertension: Secondary | ICD-10-CM | POA: Diagnosis not present

## 2024-01-14 DIAGNOSIS — E782 Mixed hyperlipidemia: Secondary | ICD-10-CM | POA: Diagnosis not present

## 2024-02-10 DIAGNOSIS — E119 Type 2 diabetes mellitus without complications: Secondary | ICD-10-CM | POA: Diagnosis not present

## 2024-03-09 DIAGNOSIS — H25013 Cortical age-related cataract, bilateral: Secondary | ICD-10-CM | POA: Diagnosis not present

## 2024-03-09 DIAGNOSIS — E1129 Type 2 diabetes mellitus with other diabetic kidney complication: Secondary | ICD-10-CM | POA: Diagnosis not present

## 2024-03-09 DIAGNOSIS — Z125 Encounter for screening for malignant neoplasm of prostate: Secondary | ICD-10-CM | POA: Diagnosis not present

## 2024-03-09 DIAGNOSIS — I10 Essential (primary) hypertension: Secondary | ICD-10-CM | POA: Diagnosis not present

## 2024-03-09 DIAGNOSIS — H2513 Age-related nuclear cataract, bilateral: Secondary | ICD-10-CM | POA: Diagnosis not present

## 2024-03-09 DIAGNOSIS — E7800 Pure hypercholesterolemia, unspecified: Secondary | ICD-10-CM | POA: Diagnosis not present

## 2024-03-09 DIAGNOSIS — F0781 Postconcussional syndrome: Secondary | ICD-10-CM | POA: Diagnosis not present

## 2024-03-12 DIAGNOSIS — E119 Type 2 diabetes mellitus without complications: Secondary | ICD-10-CM | POA: Diagnosis not present

## 2024-03-16 DIAGNOSIS — E1129 Type 2 diabetes mellitus with other diabetic kidney complication: Secondary | ICD-10-CM | POA: Diagnosis not present

## 2024-03-16 DIAGNOSIS — Z0000 Encounter for general adult medical examination without abnormal findings: Secondary | ICD-10-CM | POA: Diagnosis not present

## 2024-03-16 DIAGNOSIS — E7800 Pure hypercholesterolemia, unspecified: Secondary | ICD-10-CM | POA: Diagnosis not present

## 2024-03-16 DIAGNOSIS — I10 Essential (primary) hypertension: Secondary | ICD-10-CM | POA: Diagnosis not present

## 2024-04-12 DIAGNOSIS — E119 Type 2 diabetes mellitus without complications: Secondary | ICD-10-CM | POA: Diagnosis not present

## 2024-04-14 DIAGNOSIS — I10 Essential (primary) hypertension: Secondary | ICD-10-CM | POA: Diagnosis not present

## 2024-04-14 DIAGNOSIS — E119 Type 2 diabetes mellitus without complications: Secondary | ICD-10-CM | POA: Diagnosis not present

## 2024-04-14 DIAGNOSIS — E782 Mixed hyperlipidemia: Secondary | ICD-10-CM | POA: Diagnosis not present

## 2024-04-14 DIAGNOSIS — I2510 Atherosclerotic heart disease of native coronary artery without angina pectoris: Secondary | ICD-10-CM | POA: Diagnosis not present

## 2024-05-12 DIAGNOSIS — E119 Type 2 diabetes mellitus without complications: Secondary | ICD-10-CM | POA: Diagnosis not present

## 2024-06-12 DIAGNOSIS — E119 Type 2 diabetes mellitus without complications: Secondary | ICD-10-CM | POA: Diagnosis not present

## 2024-07-19 DIAGNOSIS — E119 Type 2 diabetes mellitus without complications: Secondary | ICD-10-CM | POA: Diagnosis not present

## 2024-07-19 DIAGNOSIS — E782 Mixed hyperlipidemia: Secondary | ICD-10-CM | POA: Diagnosis not present

## 2024-07-19 DIAGNOSIS — I2510 Atherosclerotic heart disease of native coronary artery without angina pectoris: Secondary | ICD-10-CM | POA: Diagnosis not present

## 2024-07-19 DIAGNOSIS — I10 Essential (primary) hypertension: Secondary | ICD-10-CM | POA: Diagnosis not present
